# Patient Record
Sex: Male | Born: 1970 | Race: Black or African American | Hispanic: No | Marital: Married | Smoking: Current every day smoker
Health system: Southern US, Community
[De-identification: ages and names within clinical notes are randomized; demographics above are authoritative.]

## PROBLEM LIST (undated history)

## (undated) DIAGNOSIS — G4730 Sleep apnea, unspecified: Secondary | ICD-10-CM

## (undated) DIAGNOSIS — I10 Essential (primary) hypertension: Secondary | ICD-10-CM

## (undated) DIAGNOSIS — R011 Cardiac murmur, unspecified: Secondary | ICD-10-CM

## (undated) DIAGNOSIS — I38 Endocarditis, valve unspecified: Secondary | ICD-10-CM

## (undated) DIAGNOSIS — Z8489 Family history of other specified conditions: Secondary | ICD-10-CM

## (undated) DIAGNOSIS — F1910 Other psychoactive substance abuse, uncomplicated: Secondary | ICD-10-CM

## (undated) DIAGNOSIS — K219 Gastro-esophageal reflux disease without esophagitis: Secondary | ICD-10-CM

## (undated) DIAGNOSIS — A539 Syphilis, unspecified: Secondary | ICD-10-CM

## (undated) DIAGNOSIS — E119 Type 2 diabetes mellitus without complications: Secondary | ICD-10-CM

## (undated) HISTORY — PX: WISDOM TOOTH EXTRACTION: SHX21

## (undated) HISTORY — PX: CHOLECYSTECTOMY: SHX55

---

## 2005-04-09 ENCOUNTER — Emergency Department: Payer: Self-pay | Admitting: General Practice

## 2005-04-09 ENCOUNTER — Emergency Department: Payer: Self-pay | Admitting: Unknown Physician Specialty

## 2005-12-27 ENCOUNTER — Observation Stay: Payer: Self-pay | Admitting: Surgery

## 2006-01-30 ENCOUNTER — Other Ambulatory Visit: Payer: Self-pay

## 2006-01-30 ENCOUNTER — Inpatient Hospital Stay: Payer: Self-pay | Admitting: Internal Medicine

## 2008-01-22 ENCOUNTER — Emergency Department: Payer: Self-pay | Admitting: Emergency Medicine

## 2008-01-22 ENCOUNTER — Other Ambulatory Visit: Payer: Self-pay

## 2010-09-27 ENCOUNTER — Emergency Department: Payer: Self-pay | Admitting: Emergency Medicine

## 2011-10-29 ENCOUNTER — Emergency Department: Payer: Self-pay | Admitting: Emergency Medicine

## 2016-12-16 HISTORY — PX: SOFT TISSUE MASS EXCISION: SHX2419

## 2019-08-05 ENCOUNTER — Emergency Department: Payer: Self-pay

## 2019-08-05 ENCOUNTER — Encounter: Payer: Self-pay | Admitting: Emergency Medicine

## 2019-08-05 ENCOUNTER — Other Ambulatory Visit: Payer: Self-pay

## 2019-08-05 ENCOUNTER — Emergency Department
Admission: EM | Admit: 2019-08-05 | Discharge: 2019-08-05 | Disposition: A | Discharge status: Home / Self Care | Payer: Self-pay | Attending: Emergency Medicine | Admitting: Emergency Medicine

## 2019-08-05 DIAGNOSIS — L03113 Cellulitis of right upper limb: Secondary | ICD-10-CM | POA: Insufficient documentation

## 2019-08-05 DIAGNOSIS — E119 Type 2 diabetes mellitus without complications: Secondary | ICD-10-CM | POA: Insufficient documentation

## 2019-08-05 DIAGNOSIS — F17218 Nicotine dependence, cigarettes, with other nicotine-induced disorders: Secondary | ICD-10-CM | POA: Insufficient documentation

## 2019-08-05 HISTORY — DX: Type 2 diabetes mellitus without complications: E11.9

## 2019-08-05 LAB — CBC WITH DIFFERENTIAL/PLATELET
Abs Immature Granulocytes: 0.02 10*3/uL (ref 0.00–0.07)
Basophils Absolute: 0 10*3/uL (ref 0.0–0.1)
Basophils Relative: 0 %
Eosinophils Absolute: 0.1 10*3/uL (ref 0.0–0.5)
Eosinophils Relative: 1 %
HCT: 41.1 % (ref 39.0–52.0)
Hemoglobin: 12.9 g/dL — ABNORMAL LOW (ref 13.0–17.0)
Immature Granulocytes: 0 %
Lymphocytes Relative: 30 %
Lymphs Abs: 1.7 10*3/uL (ref 0.7–4.0)
MCH: 25.9 pg — ABNORMAL LOW (ref 26.0–34.0)
MCHC: 31.4 g/dL (ref 30.0–36.0)
MCV: 82.5 fL (ref 80.0–100.0)
Monocytes Absolute: 0.6 10*3/uL (ref 0.1–1.0)
Monocytes Relative: 11 %
Neutro Abs: 3.2 10*3/uL (ref 1.7–7.7)
Neutrophils Relative %: 58 %
Platelets: 308 10*3/uL (ref 150–400)
RBC: 4.98 MIL/uL (ref 4.22–5.81)
RDW: 12.4 % (ref 11.5–15.5)
WBC: 5.7 10*3/uL (ref 4.0–10.5)
nRBC: 0 % (ref 0.0–0.2)

## 2019-08-05 LAB — COMPREHENSIVE METABOLIC PANEL
ALT: 62 U/L — ABNORMAL HIGH (ref 0–44)
AST: 46 U/L — ABNORMAL HIGH (ref 15–41)
Albumin: 3.3 g/dL — ABNORMAL LOW (ref 3.5–5.0)
Alkaline Phosphatase: 82 U/L (ref 38–126)
Anion gap: 7 (ref 5–15)
BUN: 13 mg/dL (ref 6–20)
CO2: 24 mmol/L (ref 22–32)
Calcium: 8.6 mg/dL — ABNORMAL LOW (ref 8.9–10.3)
Chloride: 103 mmol/L (ref 98–111)
Creatinine, Ser: 0.72 mg/dL (ref 0.61–1.24)
GFR calc Af Amer: >60 mL/min (ref >60)
GFR calc non Af Amer: >60 mL/min (ref >60)
Glucose, Bld: 258 mg/dL — ABNORMAL HIGH (ref 70–99)
Potassium: 3.6 mmol/L (ref 3.5–5.1)
Sodium: 134 mmol/L — ABNORMAL LOW (ref 135–145)
Total Bilirubin: 1 mg/dL (ref 0.3–1.2)
Total Protein: 7.3 g/dL (ref 6.5–8.1)

## 2019-08-05 MED ORDER — IBUPROFEN 600 MG PO TABS
600.0000 mg | ORAL_TABLET | Freq: Once | ORAL | Status: AC
  Administered 2019-08-05: 600 mg via ORAL
  Filled 2019-08-05: qty 1

## 2019-08-05 MED ORDER — CLINDAMYCIN HCL 150 MG PO CAPS: ORAL_CAPSULE | ORAL | 0 refills | Status: DC

## 2019-08-05 MED ORDER — HYDROCODONE-ACETAMINOPHEN 5-325 MG PO TABS: 1.0000 | ORAL_TABLET | Freq: Four times a day (QID) | ORAL | 0 refills | Status: DC | PRN

## 2019-08-05 MED ORDER — CLINDAMYCIN PHOSPHATE 600 MG/50ML IV SOLN
600.0000 mg | Freq: Once | INTRAVENOUS | Status: AC
  Administered 2019-08-05: 600 mg via INTRAVENOUS
  Filled 2019-08-05: qty 50

## 2019-08-05 NOTE — Discharge Instructions (Addendum)
Call make an appointment with Dr. Harden Mo office if any continued problems.  Moist warm compresses or soak your hand in warm water.  Elevate your hand as needed for swelling.  Begin taking antibiotics as directed until completely finished.  Also a prescription for a narcotic was sent to your pharmacy to help control pain.  You may take ibuprofen with this medication if additional pain medication is needed.  Return to the emergency department if any severe worsening of your symptoms.

## 2019-08-05 NOTE — ED Provider Notes (Signed)
[facility] Emergency Department Provider Note   ____________________________________________   First MD Initiated Contact with Patient 08/05/19 1303     (approximate)  I have reviewed the triage vital signs and the nursing notes.   HISTORY  Chief Complaint Hand Pain   HPI Cameron Watkins is a 48 y.o. male presents to the ED with complaint of right hand pain and swelling.  Patient states that last week he hit his hand while at work and expected it to get better.  He states he had a small abrasion to the dorsal aspect of his hand that bled a small amount.  He denies any fever or chills.  He denies any other injury.  He is continue to use his hand since his injury.  He reports that his hand has been warm to touch.  He is right-hand dominant.  Is his pain as an 8 out of 10.        Past Medical History:  Diagnosis Date  . Diabetes mellitus without complication (HCC)     There are no active problems to display for this patient.   History reviewed. No pertinent surgical history.  Prior to Admission medications   Medication Sig Start Date End Date Taking? Authorizing Provider  clindamycin (CLEOCIN) 150 MG capsule 2 tabs tid until finished 08/05/19   Tommi RumpsSummers, Thamas Appleyard L, PA-C  HYDROcodone-acetaminophen (NORCO/VICODIN) 5-325 MG tablet Take 1 tablet by mouth every 6 (six) hours as needed for moderate pain. 08/05/19   Tommi RumpsSummers, Farrin Shadle L, PA-C    Allergies Bee venom and Penicillins  No family history on file.  Social History Social History   Tobacco Use  . Smoking status: Current Every Day Smoker    Types: Cigars  . Smokeless tobacco: Never Used  Substance Use Topics  . Alcohol use: Not Currently  . Drug use: Not Currently    Review of Systems Constitutional: No fever/chills Cardiovascular: Denies chest pain. Respiratory: Denies shortness of breath. Musculoskeletal: Positive for right hand pain. Skin: Right hand warm to touch. Neurological:  Negative for focal weakness or numbness. ____________________________________________   PHYSICAL EXAM:  VITAL SIGNS: ED Triage Vitals [08/05/19 1214]  Enc Vitals Group     BP (!) 141/90     Pulse Rate (!) 106     Resp 18     Temp 99 F (37.2 C)     Temp Source Oral     SpO2 97 %     Weight 290 lb (131.5 kg)     Height 6\' 3"  (1.905 m)     Head Circumference      Peak Flow      Pain Score 8     Pain Loc      Pain Edu?      Excl. in GC?    Constitutional: Alert and oriented. Well appearing and in no acute distress. Eyes: Conjunctivae are normal. Head: Atraumatic. Neck: No stridor.   Cardiovascular: Normal rate, regular rhythm. Grossly normal heart sounds.  Good peripheral circulation. Respiratory: Normal respiratory effort.  No retractions. Lungs CTAB. Musculoskeletal: On examination of the right hand the dorsal aspect is edematous with mild erythema and warmth.  There is a healing abrasion noted to the MP joint of the third digit.  Patient is able to flex and extend his third digit but PIP joint is edematous and tender to touch.  There is also some erythema noted across the wrist and forearm consistent with a cellulitis.  No drainage present. Neurologic:  Normal  speech and language. No gross focal neurologic deficits are appreciated.  Skin:  Skin is warm, dry and intact.  Psychiatric: Mood and affect are normal. Speech and behavior are normal.  ____________________________________________   LABS (all labs ordered are listed, but only abnormal results are displayed)  Labs Reviewed  CBC WITH DIFFERENTIAL/PLATELET - Abnormal; Notable for the following components:      Result Value   Hemoglobin 12.9 (*)    MCH 25.9 (*)    All other components within normal limits  COMPREHENSIVE METABOLIC PANEL - Abnormal; Notable for the following components:   Sodium 134 (*)    Glucose, Bld 258 (*)    Calcium 8.6 (*)    Albumin 3.3 (*)    AST 46 (*)    ALT 62 (*)    All other  components within normal limits    RADIOLOGY  ED MD interpretation:  Right hand x-ray is negative for acute bony injury.  Official radiology report(s): Dg Hand Complete Right  Result Date: 08/05/2019 CLINICAL DATA:  Right hand pain and swelling since injury last week involving third digit. EXAM: RIGHT HAND - COMPLETE 3+ VIEW COMPARISON:  None. FINDINGS: There is no evidence of fracture or dislocation. There is no evidence of arthropathy or other focal bone abnormality. Soft tissues are unremarkable. IMPRESSION: Negative. Electronically Signed   By: Marin Olp M.D.   On: 08/05/2019 12:58    ____________________________________________   PROCEDURES  Procedure(s) performed (including Critical Care):  Procedures   ____________________________________________   INITIAL IMPRESSION / ASSESSMENT AND PLAN / ED COURSE  As part of my medical decision making, I reviewed the following data within the electronic MEDICAL RECORD NUMBER Notes from prior ED visits and [place] Controlled Substance Database   48 year old male presents to the ED with complaint of right hand pain after he hit something at work.  He states that his hand has been warm and tender to touch.  There is what appears to be a healing abrasion to his third MP joint dorsal aspect.  This possibly could have been an abrasion that now is infected and patient has cellulitis.  His diabetes he states is usually under control.  Nonfasting blood sugar was 258 today.  Patient was given clindamycin 600 mg IV and a prescription to continue on the same.  He is to follow-up with his PCP if any continued problems.  He is told to return to the emergency department if any worsening of his symptoms.  ____________________________________________   FINAL CLINICAL IMPRESSION(S) / ED DIAGNOSES  Final diagnoses:  Cellulitis of right hand     ED Discharge Orders         Ordered    clindamycin (CLEOCIN) 150 MG capsule  Status:  Discontinued      08/05/19 1517    HYDROcodone-acetaminophen (NORCO/VICODIN) 5-325 MG tablet  Every 6 hours PRN     08/05/19 1517    clindamycin (CLEOCIN) 150 MG capsule     08/05/19 1517           Note:  This document was prepared using Dragon voice recognition software and may include unintentional dictation errors.    Johnn Hai, PA-C 08/05/19 1539    Earleen Newport, MD 08/08/19 [phone]

## 2019-08-05 NOTE — ED Triage Notes (Signed)
Pt presents to ED via POV with c/o R hand pain and swelling. Pt states last week injured 3rd digit on R hand while at work, never had it evaluated, states swelling of hand started last night. Pt states not a worker's comp at this time.

## 2019-10-20 ENCOUNTER — Ambulatory Visit: Payer: Self-pay

## 2019-10-20 ENCOUNTER — Other Ambulatory Visit: Payer: Self-pay

## 2019-10-20 ENCOUNTER — Ambulatory Visit: Payer: Self-pay | Admitting: Physician Assistant

## 2019-10-20 DIAGNOSIS — N341 Nonspecific urethritis: Secondary | ICD-10-CM

## 2019-10-20 DIAGNOSIS — Z113 Encounter for screening for infections with a predominantly sexual mode of transmission: Secondary | ICD-10-CM

## 2019-10-20 LAB — GRAM STAIN

## 2019-10-20 MED ORDER — AZITHROMYCIN 500 MG PO TABS
1000.0000 mg | ORAL_TABLET | Freq: Once | ORAL | Status: AC
  Administered 2019-10-20: 12:00:00 1000 mg via ORAL

## 2019-10-21 ENCOUNTER — Encounter: Payer: Self-pay | Admitting: Physician Assistant

## 2019-10-21 NOTE — Progress Notes (Signed)
    STI clinic/screening visit  Subjective:  Cameron Watkins is a 48 y.o. male being seen today for an STI screening visit. The patient reports they do have symptoms.  Patient has the following medical conditions:  There are no active problems to display for this patient.    Chief Complaint  Patient presents with  . SEXUALLY TRANSMITTED DISEASE    HPI  Patient reports that he has had dysuria for 1 week.  Denies other symptoms and requests screening today.  Last void < 2 hr prior to specimen collection.  See flowsheet for further details and programmatic requirements.    The following portions of the patient's history were reviewed and updated as appropriate: allergies, current medications, past medical history, past social history, past surgical history and problem list.  Objective:  There were no vitals filed for this visit.  Physical Exam Constitutional:      General: He is not in acute distress.    Appearance: Normal appearance.  HENT:     Head: Normocephalic and atraumatic.     Mouth/Throat:     Mouth: Mucous membranes are moist.     Pharynx: Oropharynx is clear. No oropharyngeal exudate or posterior oropharyngeal erythema.  Eyes:     Conjunctiva/sclera: Conjunctivae normal.  Neck:     Musculoskeletal: Neck supple.  Pulmonary:     Effort: Pulmonary effort is normal.  Abdominal:     Palpations: Abdomen is soft. There is no mass.     Tenderness: There is no abdominal tenderness. There is no guarding or rebound.  Genitourinary:    Penis: Normal.      Scrotum/Testes: Normal.     Comments: Pubic area without nits, lice, edema, erythema, lesions and inguinal adenopathy. Penis circumcised and without discharge from meatus. Lymphadenopathy:     Cervical: No cervical adenopathy.  Skin:    General: Skin is warm and dry.     Findings: No bruising, erythema, lesion or rash.  Neurological:     Mental Status: He is alert and oriented to person, place, and time.   Psychiatric:        Mood and Affect: Mood normal.        Behavior: Behavior normal.        Thought Content: Thought content normal.        Judgment: Judgment normal.       Assessment and Plan:  Cameron Watkins is a 48 y.o. male presenting to the [facility] Department for STI screening  1. Screening for STD (sexually transmitted disease) Patient with symptoms today.   Rec condoms with all sex. Await test results.  Counseled that RN will call if needs to RTC for further treatment once results are back. - Gram stain - HBV Antigen/Antibody State Lab - HIV/HCV [place] [facility] - Syphilis Serology,  Lab - Gonococcus culture - Gonococcus culture  2. 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 No sex for 7 days and until after partner completes treatment. RTC if vomits < 2 hr after taking medicine. - azithromycin (ZITHROMAX) tablet 1,000 mg     No follow-ups on file.  No future appointments.  Jerene Dilling, PA

## 2019-10-25 LAB — GONOCOCCUS CULTURE

## 2019-10-26 LAB — HEPATITIS B SURFACE ANTIGEN

## 2019-10-26 LAB — HM HIV SCREENING LAB: HM HIV Screening: NEGATIVE

## 2019-10-26 LAB — HM HEPATITIS C SCREENING LAB: HM Hepatitis Screen: NEGATIVE

## 2019-12-31 ENCOUNTER — Other Ambulatory Visit: Payer: Self-pay

## 2019-12-31 ENCOUNTER — Emergency Department
Admission: EM | Admit: 2019-12-31 | Discharge: 2019-12-31 | Disposition: A | Discharge status: Home / Self Care | Payer: Self-pay | Attending: Emergency Medicine | Admitting: Emergency Medicine

## 2019-12-31 ENCOUNTER — Emergency Department: Payer: Self-pay

## 2019-12-31 ENCOUNTER — Encounter: Payer: Self-pay | Admitting: Intensive Care

## 2019-12-31 DIAGNOSIS — F17290 Nicotine dependence, other tobacco product, uncomplicated: Secondary | ICD-10-CM | POA: Diagnosis not present

## 2019-12-31 DIAGNOSIS — E119 Type 2 diabetes mellitus without complications: Secondary | ICD-10-CM | POA: Insufficient documentation

## 2019-12-31 DIAGNOSIS — Y999 Unspecified external cause status: Secondary | ICD-10-CM | POA: Insufficient documentation

## 2019-12-31 DIAGNOSIS — S39012A Strain of muscle, fascia and tendon of lower back, initial encounter: Secondary | ICD-10-CM | POA: Insufficient documentation

## 2019-12-31 DIAGNOSIS — Y92410 Unspecified street and highway as the place of occurrence of the external cause: Secondary | ICD-10-CM | POA: Diagnosis not present

## 2019-12-31 DIAGNOSIS — Y9389 Activity, other specified: Secondary | ICD-10-CM | POA: Insufficient documentation

## 2019-12-31 DIAGNOSIS — S3992XA Unspecified injury of lower back, initial encounter: Secondary | ICD-10-CM | POA: Diagnosis present

## 2019-12-31 MED ORDER — CYCLOBENZAPRINE HCL 10 MG PO TABS: 10.0000 mg | ORAL_TABLET | Freq: Three times a day (TID) | ORAL | 0 refills | Status: DC | PRN

## 2019-12-31 MED ORDER — OXYCODONE-ACETAMINOPHEN 5-325 MG PO TABS
1.0000 | ORAL_TABLET | Freq: Once | ORAL | Status: AC
  Administered 2019-12-31: 1 via ORAL
  Filled 2019-12-31: qty 1

## 2019-12-31 MED ORDER — TRAMADOL HCL 50 MG PO TABS: 50.0000 mg | ORAL_TABLET | Freq: Four times a day (QID) | ORAL | 0 refills | Status: DC | PRN

## 2019-12-31 MED ORDER — MELOXICAM 15 MG PO TABS: 15.0000 mg | ORAL_TABLET | Freq: Every day | ORAL | 2 refills | Status: AC
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
## 2019-12-31 NOTE — Discharge Instructions (Addendum)
Follow-up with your regular doctor or Dr. Allena Katz if not better in 1 week.  Use medications as prescribed.  Return emergency department worsening.

## 2019-12-31 NOTE — ED Notes (Signed)
esign not working pt verbalized discharge instructions and has no questions at this time 

## 2020-10-30 ENCOUNTER — Ambulatory Visit: Payer: Self-pay | Admitting: Physician Assistant

## 2020-10-30 ENCOUNTER — Other Ambulatory Visit: Payer: Self-pay

## 2020-10-30 ENCOUNTER — Encounter: Payer: Self-pay | Admitting: Physician Assistant

## 2020-10-30 DIAGNOSIS — Z113 Encounter for screening for infections with a predominantly sexual mode of transmission: Secondary | ICD-10-CM

## 2020-10-30 DIAGNOSIS — E119 Type 2 diabetes mellitus without complications: Secondary | ICD-10-CM

## 2020-10-30 DIAGNOSIS — Z202 Contact with and (suspected) exposure to infections with a predominantly sexual mode of transmission: Secondary | ICD-10-CM

## 2020-10-30 LAB — GRAM STAIN

## 2020-10-30 MED ORDER — METRONIDAZOLE 500 MG PO TABS: 500.0000 mg | ORAL_TABLET | Freq: Two times a day (BID) | ORAL | 0 refills | Status: DC

## 2020-10-30 NOTE — Progress Notes (Signed)
[facility] STI clinic/screening visit  Subjective:  Cameron Watkins is a 49 y.o. male being seen today for an STI screening visit. The patient reports they do not have symptoms.    Patient has the following medical conditions:  There are no problems to display for this patient.    Chief Complaint  Patient presents with  . SEXUALLY TRANSMITTED DISEASE    screening    HPI  Patient reports that he is not having any symptoms but is a contact to Trich and would like a screening today.  Denies surgeries and states that he takes Metformin for DM as directed.  States last HIV test was 6 months ago and last void prior to sample collection for Gram stain was over 2 hr ago.   See flowsheet for further details and programmatic requirements.    The following portions of the patient's history were reviewed and updated as appropriate: allergies, current medications, past medical history, past social history, past surgical history and problem list.  Objective:  There were no vitals filed for this visit.  Physical Exam Constitutional:      General: He is not in acute distress.    Appearance: Normal appearance.  HENT:     Head: Normocephalic and atraumatic.     Comments: No nits,lice, or hair loss. No cervical, supraclavicular or axillary adenopathy.    Mouth/Throat:     Mouth: Mucous membranes are moist.     Pharynx: Oropharynx is clear. No oropharyngeal exudate or posterior oropharyngeal erythema.  Eyes:     Conjunctiva/sclera: Conjunctivae normal.  Pulmonary:     Effort: Pulmonary effort is normal.  Abdominal:     Palpations: Abdomen is soft. There is no mass.     Tenderness: There is no abdominal tenderness. There is no guarding or rebound.  Genitourinary:    Penis: Normal.      Testes: Normal.     Comments: Pubic area without nits, lice, hair loss, edema, erythema, lesions and inguinal adenopathy. Penis circumcised without rash, lesions and discharge  at meatus. Musculoskeletal:     Cervical back: Neck supple. No tenderness.  Skin:    General: Skin is warm and dry.     Findings: No bruising, erythema, lesion or rash.  Neurological:     Mental Status: He is alert and oriented to person, place, and time.  Psychiatric:        Mood and Affect: Mood normal.        Behavior: Behavior normal.        Thought Content: Thought content normal.        Judgment: Judgment normal.       Assessment and Plan:  Cameron Watkins is a 49 y.o. male presenting to the [facility] Department for STI screening  1. Screening for STD (sexually transmitted disease) Patient into clinic without symptoms. Rec condoms with all sex. Await test results.  Counseled that RN will call if needs to RTC for treatment once results are back. - Gram stain - Gonococcus culture - HIV [place] [facility] - Syphilis Serology, [facility] - Gonococcus culture  2. Trichomonas contact Will treat as a contact to Trich with Metronidazole 500 mg # 14 1 po BID for 7 days with food, no EtOH for 24 hr before and until 72 hr after completing medicine. No sex for 10 days and until after partner completes treatment. Call with questions or concerns. - metroNIDAZOLE (FLAGYL) 500 MG tablet; Take 1 tablet (500 mg  total) by mouth 2 (two) times daily.  Dispense: 14 tablet; Refill: 0     No follow-ups on file.  No future appointments.  Matt Holmes, PA

## 2020-11-03 NOTE — Progress Notes (Signed)
Chart reviewed by Pharmacist  Suzanne Walker PharmD, Contract Pharmacist at [facility]  

## 2020-11-04 LAB — GONOCOCCUS CULTURE

## 2020-11-07 ENCOUNTER — Telehealth: Payer: Self-pay

## 2020-11-13 ENCOUNTER — Other Ambulatory Visit: Payer: Self-pay

## 2020-11-13 ENCOUNTER — Ambulatory Visit: Payer: Self-pay

## 2020-11-13 DIAGNOSIS — A539 Syphilis, unspecified: Secondary | ICD-10-CM

## 2020-11-13 MED ORDER — DOXYCYCLINE HYCLATE 100 MG PO TABS: 100.0000 mg | ORAL_TABLET | Freq: Two times a day (BID) | ORAL | 0 refills | Status: AC

## 2020-11-13 NOTE — Progress Notes (Signed)
Co to RTC in 6 and 12 months Richmond Campbell, RN

## 2020-11-22 NOTE — Telephone Encounter (Signed)
Tx'd Alira Fretwell, RN  

## 2021-05-29 ENCOUNTER — Emergency Department: Payer: Self-pay

## 2021-05-29 ENCOUNTER — Emergency Department
Admission: EM | Admit: 2021-05-29 | Discharge: 2021-05-29 | Disposition: A | Discharge status: Home / Self Care | Payer: Self-pay | Attending: Emergency Medicine | Admitting: Emergency Medicine

## 2021-05-29 ENCOUNTER — Other Ambulatory Visit: Payer: Self-pay

## 2021-05-29 DIAGNOSIS — R079 Chest pain, unspecified: Secondary | ICD-10-CM | POA: Insufficient documentation

## 2021-05-29 DIAGNOSIS — R110 Nausea: Secondary | ICD-10-CM | POA: Insufficient documentation

## 2021-05-29 DIAGNOSIS — Z7984 Long term (current) use of oral hypoglycemic drugs: Secondary | ICD-10-CM | POA: Insufficient documentation

## 2021-05-29 DIAGNOSIS — E119 Type 2 diabetes mellitus without complications: Secondary | ICD-10-CM | POA: Insufficient documentation

## 2021-05-29 DIAGNOSIS — R42 Dizziness and giddiness: Secondary | ICD-10-CM | POA: Insufficient documentation

## 2021-05-29 DIAGNOSIS — F17290 Nicotine dependence, other tobacco product, uncomplicated: Secondary | ICD-10-CM | POA: Insufficient documentation

## 2021-05-29 LAB — CBC
HCT: 41.4 % (ref 39.0–52.0)
Hemoglobin: 13.7 g/dL (ref 13.0–17.0)
MCH: 27.7 pg (ref 26.0–34.0)
MCHC: 33.1 g/dL (ref 30.0–36.0)
MCV: 83.6 fL (ref 80.0–100.0)
Platelets: 243 10*3/uL (ref 150–400)
RBC: 4.95 MIL/uL (ref 4.22–5.81)
RDW: 12.5 % (ref 11.5–15.5)
WBC: 3.5 10*3/uL — ABNORMAL LOW (ref 4.0–10.5)
nRBC: 0 % (ref 0.0–0.2)

## 2021-05-29 LAB — BASIC METABOLIC PANEL
Anion gap: 6 (ref 5–15)
BUN: 11 mg/dL (ref 6–20)
CO2: 24 mmol/L (ref 22–32)
Calcium: 8.6 mg/dL — ABNORMAL LOW (ref 8.9–10.3)
Chloride: 105 mmol/L (ref 98–111)
Creatinine, Ser: 0.88 mg/dL (ref 0.61–1.24)
GFR, Estimated: >60 mL/min (ref >60)
Glucose, Bld: 104 mg/dL — ABNORMAL HIGH (ref 70–99)
Potassium: 3.2 mmol/L — ABNORMAL LOW (ref 3.5–5.1)
Sodium: 135 mmol/L (ref 135–145)

## 2021-05-29 LAB — TROPONIN I (HIGH SENSITIVITY)
Troponin I (High Sensitivity): 4 ng/L (ref <18)
Troponin I (High Sensitivity): 3 ng/L (ref <18)

## 2021-05-29 LAB — D-DIMER, QUANTITATIVE: D-Dimer, Quant: <0.27 ug/mL-FEU (ref 0.00–0.50)

## 2021-05-29 MED ORDER — METFORMIN HCL 500 MG PO TABS: 500.0000 mg | ORAL_TABLET | Freq: Two times a day (BID) | ORAL | 0 refills | Status: DC

## 2021-05-29 MED ORDER — POTASSIUM CHLORIDE CRYS ER 20 MEQ PO TBCR
40.0000 meq | EXTENDED_RELEASE_TABLET | Freq: Once | ORAL | Status: AC
  Administered 2021-05-29: 40 meq via ORAL
  Filled 2021-05-29: qty 2

## 2021-05-29 NOTE — ED Notes (Signed)
See triage note  Presents with some rapid heart rate and chest discomfort  States sx's started suddenly while sitting in car  Then felt some discomfort in left arm  Stats sx's are better at present  Pt is also out of meds   Metformin 500mg  and lisinopril 10 mg

## 2021-05-29 NOTE — ED Triage Notes (Addendum)
First RN Note: Pt to ED via [facility] with c/o substernal CP that radiates to L arm, per EMS pt reports CP has resolved, now has continued L arm weakness/numbness. Per EMS stroke screen negative, 324 ASA given en route.   134/86 102 ST CBG 121 100% RA

## 2021-05-29 NOTE — Discharge Instructions (Addendum)

## 2021-05-29 NOTE — ED Triage Notes (Signed)
Pt comes into the ED via EMS from home, pt states he had sudden onset left sided chest pain with left arm heaviness  and SOB, states his cousin gave him an aspirin   Pt is in NAD at present. Arrives with #20g to the [facility]

## 2021-05-29 NOTE — ED Provider Notes (Signed)
[facility] Emergency Department Provider Note   ____________________________________________   Event Date/Time   First MD Initiated Contact with Patient 05/29/21 1155     (approximate)  I have reviewed the triage vital signs and the nursing notes.   HISTORY  Chief Complaint Chest Pain    HPI A 50 year old patient with a history of treated diabetes, hypertension and obesity presents for evaluation of chest pain. Initial onset of pain was approximately 1-3 hours ago. The patient's chest pain is described as heaviness/pressure/tightness and is not worse with exertion. The patient complains of nausea. The patient's chest pain is middle- or left-sided, is not well-localized, is not sharp and does radiate to the arms/jaw/neck. The patient denies diaphoresis. The patient has smoked in the past 90 days and has a family history of coronary artery disease in a first-degree relative with onset less than age 14. The patient has no history of stroke, has no history of peripheral artery disease and has no history of hypercholesterolemia.   Patient reports he feels much improved now.  Was treated with 324 mg of aspirin with EMS.  Pain came on as he was driving his car into family's driveway.  He parked the car and noticed that he started having pain in his left upper chest.  This then radiated into his chest he felt nauseated lightheaded, and then thereafter reports he felt his heart was racing very fast, and he then began to feel numb in both of his arms and discomfort radiated towards his left upper chest and left arm as well  He had does have diabetes, currently not taking any medication however reports he ran out of metformin about a month ago.  He has no history personal history of heart disease but his mom has a history of coronary disease  He has never had a stress test.  His symptoms have all abated now and he feels improved.  He felt like his arms were numb but that  has improved.  He reports that all symptoms feel better now.  No history of blood clots.  He is a smoker.  No noted leg swelling though occasionally he will see some swelling in both of his feet that will come and go  No sharp chest pain.  Past Medical History:  Diagnosis Date   Diabetes mellitus without complication [facility]     Patient Active Problem List   Diagnosis Date Noted   Diabetes mellitus without complication (HCC) 10/30/2020    History reviewed. No pertinent surgical history.  Prior to Admission medications   Medication Sig Start Date End Date Taking? Authorizing Provider  metFORMIN (GLUCOPHAGE) 500 MG tablet Take 1 tablet (500 mg total) by mouth 2 (two) times daily with a meal. 05/29/21  Yes Sharyn Creamer, MD    Allergies Bee venom and Penicillins  No family history on file.  Social History Social History   Tobacco Use   Smoking status: Every Day    Pack years: 0.00    Types: Cigars   Smokeless tobacco: Never  Substance Use Topics   Alcohol use: Yes    Alcohol/week: 4.0 standard drinks    Types: 4 Shots of liquor per week   Drug use: Yes    Types: Marijuana    Review of Systems Constitutional: No fever/chills Eyes: No visual changes. ENT: No sore throat. Cardiovascular: See HPI Respiratory: Denies shortness of breath. Gastrointestinal: No abdominal pain.   Genitourinary: Negative for dysuria. Musculoskeletal: Negative for back pain. Skin: Negative  for rash. Neurological: Negative for headaches or focal weakness, but did feel numb in both his arms but left greater than right.    ____________________________________________   PHYSICAL EXAM:  VITAL SIGNS: ED Triage Vitals  Enc Vitals Group     BP 05/29/21 1054 137/84     Pulse Rate 05/29/21 1054 83     Resp 05/29/21 1054 17     Temp 05/29/21 1054 99.7 F (37.6 C)     Temp Source 05/29/21 1054 Oral     SpO2 05/29/21 1054 99 %     Weight 05/29/21 1055 290 lb (131.5 kg)     Height 05/29/21  1055 6\' 4"  (1.93 m)     Head Circumference --      Peak Flow --      Pain Score 05/29/21 1055 4     Pain Loc --      Pain Edu? --      Excl. in GC? --     Constitutional: Alert and oriented. Well appearing and in no acute distress.  Very normal very reassuring examination at this time.  Patient reports all symptoms have abated Eyes: Conjunctivae are normal. Head: Atraumatic. Nose: No congestion/rhinnorhea. Mouth/Throat: Mucous membranes are moist. Neck: No stridor.  Cardiovascular: Normal rate, regular rhythm. Grossly normal heart sounds.  Good peripheral circulation. Respiratory: Normal respiratory effort.  No retractions. Lungs CTAB. Gastrointestinal: Soft and nontender. No distention. Musculoskeletal: No lower extremity tenderness nor edema. Neurologic:  Normal speech and language. No gross focal neurologic deficits are appreciated.  Skin:  Skin is warm, dry and intact. No rash noted. Psychiatric: Mood and affect are normal. Speech and behavior are normal.  ____________________________________________   LABS (all labs ordered are listed, but only abnormal results are displayed)  Labs Reviewed  BASIC METABOLIC PANEL - Abnormal; Notable for the following components:      Result Value   Potassium 3.2 (*)    Glucose, Bld 104 (*)    Calcium 8.6 (*)    All other components within normal limits  CBC - Abnormal; Notable for the following components:   WBC 3.5 (*)    All other components within normal limits  D-DIMER, QUANTITATIVE  TROPONIN I (HIGH SENSITIVITY)  TROPONIN I (HIGH SENSITIVITY)   ____________________________________________  EKG  ED ECG REPORT I, 05/31/21, the attending physician, personally viewed and interpreted this ECG.  Date: 05/29/2021 EKG Time: 1102 Rate: 85 Rhythm: normal sinus rhythm QRS Axis: normal Intervals: normal ST/T Wave abnormalities: normal Narrative Interpretation: no evidence of acute  ischemia  ____________________________________________  RADIOLOGY  DG Chest 2 View  Result Date: 05/29/2021 CLINICAL DATA:  49 year old male with substernal chest pain radiating to the left arm. EXAM: CHEST - 2 VIEW COMPARISON:  None. FINDINGS: Mildly low lung volumes. Normal cardiac size and mediastinal contours. Visualized tracheal air column is within normal limits. Both lungs appear clear. No pneumothorax or pleural effusion. Cholecystectomy clips and paucity of bowel gas in the upper abdomen. No osseous abnormality identified. IMPRESSION: Negative.  No cardiopulmonary abnormality. Electronically Signed   By: 59 M.D.   On: 05/29/2021 11:51     ____________________________________________   PROCEDURES  Procedure(s) performed: None  Procedures  Critical Care performed: No  ____________________________________________   INITIAL IMPRESSION / ASSESSMENT AND PLAN / ED COURSE  Pertinent labs & imaging results that were available during my care of the patient were reviewed by me and considered in my medical decision making (see chart for details).   Differential diagnosis  includes, but is not limited to, ACS, aortic dissection, pulmonary embolism, cardiac tamponade, pneumothorax, pneumonia, pericarditis, myocarditis, GI-related causes including esophagitis/gastritis, and musculoskeletal chest wall pain.  No signs or symptoms that would suggest dissection.  No infectious symptoms.  Reassuring exam, normal chest x-ray.   Clinical Course as of 05/29/21 1405  Tue May 29, 2021  1155 WBC(!): 3.5 [MQ]  1155 Potassium(!): 3.2 Mild low. Prior WBC 5 a year prior [MQ]  1308 D-Dimer, Quant: <0.27 Normal (rule out PE) [MQ]    Clinical Course User Index [MQ] Sharyn Creamer, MD   HEAR Score: 5   ----------------------------------------- 2:04 PM on 05/29/2021 ----------------------------------------- Patient asymptomatic, ambulatory.  Reports he is ready to go and will follow-up very  closely with cardiology.  He reports that he has been out of his metformin now for over a month, he lost his previous primary care physician.  He was taking 500 mg twice a day and would like a refill on this which I will provide him.  Understands careful return precautions especially those around chest pain and a recommendation to follow-up with cardiology for which I have also placed referral but he will call today to set up.  Return precautions and treatment recommendations and follow-up discussed with the patient who is agreeable with the plan.   ____________________________________________   FINAL CLINICAL IMPRESSION(S) / ED DIAGNOSES  Final diagnoses:  Chest pain with low risk of acute coronary syndrome        Note:  This document was prepared using Dragon voice recognition software and may include unintentional dictation errors       Sharyn Creamer, MD 05/29/21 1405

## 2021-08-06 ENCOUNTER — Other Ambulatory Visit: Payer: Self-pay

## 2021-08-06 DIAGNOSIS — E11649 Type 2 diabetes mellitus with hypoglycemia without coma: Secondary | ICD-10-CM | POA: Insufficient documentation

## 2021-08-06 DIAGNOSIS — Z7984 Long term (current) use of oral hypoglycemic drugs: Secondary | ICD-10-CM | POA: Insufficient documentation

## 2021-08-06 DIAGNOSIS — F17290 Nicotine dependence, other tobacco product, uncomplicated: Secondary | ICD-10-CM | POA: Insufficient documentation

## 2021-08-06 DIAGNOSIS — Z20822 Contact with and (suspected) exposure to covid-19: Secondary | ICD-10-CM | POA: Insufficient documentation

## 2021-08-06 LAB — CBC
HCT: 42.6 % (ref 39.0–52.0)
Hemoglobin: 13.7 g/dL (ref 13.0–17.0)
MCH: 27 pg (ref 26.0–34.0)
MCHC: 32.2 g/dL (ref 30.0–36.0)
MCV: 84 fL (ref 80.0–100.0)
Platelets: 286 10*3/uL (ref 150–400)
RBC: 5.07 MIL/uL (ref 4.22–5.81)
RDW: 12 % (ref 11.5–15.5)
WBC: 3.8 10*3/uL — ABNORMAL LOW (ref 4.0–10.5)
nRBC: 0 % (ref 0.0–0.2)

## 2021-08-06 LAB — BASIC METABOLIC PANEL
Anion gap: 8 (ref 5–15)
BUN: 11 mg/dL (ref 6–20)
CO2: 25 mmol/L (ref 22–32)
Calcium: 9 mg/dL (ref 8.9–10.3)
Chloride: 103 mmol/L (ref 98–111)
Creatinine, Ser: 0.98 mg/dL (ref 0.61–1.24)
GFR, Estimated: >60 mL/min (ref >60)
Glucose, Bld: 145 mg/dL — ABNORMAL HIGH (ref 70–99)
Potassium: 3.3 mmol/L — ABNORMAL LOW (ref 3.5–5.1)
Sodium: 136 mmol/L (ref 135–145)

## 2021-08-06 LAB — CBG MONITORING, ED
Glucose-Capillary: 160 mg/dL — ABNORMAL HIGH (ref 70–99)
Glucose-Capillary: 75 mg/dL (ref 70–99)

## 2021-08-06 LAB — URINALYSIS, COMPLETE (UACMP) WITH MICROSCOPIC
Bacteria, UA: NONE SEEN
Bilirubin Urine: NEGATIVE
Glucose, UA: NEGATIVE mg/dL
Hgb urine dipstick: NEGATIVE
Ketones, ur: NEGATIVE mg/dL
Leukocytes,Ua: NEGATIVE
Nitrite: NEGATIVE
Protein, ur: NEGATIVE mg/dL
Specific Gravity, Urine: 1.012 (ref 1.005–1.030)
pH: 6 (ref 5.0–8.0)

## 2021-08-06 NOTE — ED Notes (Signed)
Pt comes via EMs with c/o hyper/hypo glycemia. Pt states his sugars have been going up and down. Pt states UC d/c his metformin. EMs reports CBG was 64 per pt he ate something and then it was 167. Pt states hx of this and was dx with a STI. Pt states some burning from penis.  VSS 18 g lac

## 2021-08-06 NOTE — ED Notes (Signed)
Pt given crackers at this time.

## 2021-08-07 ENCOUNTER — Emergency Department
Admission: EM | Admit: 2021-08-07 | Discharge: 2021-08-07 | Disposition: A | Discharge status: Home / Self Care | Payer: Self-pay | Attending: Emergency Medicine | Admitting: Emergency Medicine

## 2021-08-07 DIAGNOSIS — E162 Hypoglycemia, unspecified: Secondary | ICD-10-CM

## 2021-08-07 LAB — CHLAMYDIA/NGC RT PCR (ARMC ONLY)
Chlamydia Tr: NOT DETECTED
N gonorrhoeae: NOT DETECTED

## 2021-08-07 LAB — CBG MONITORING, ED
Glucose-Capillary: 104 mg/dL — ABNORMAL HIGH (ref 70–99)
Glucose-Capillary: 94 mg/dL (ref 70–99)

## 2021-08-07 LAB — RESP PANEL BY RT-PCR (FLU A&B, COVID) ARPGX2
Influenza A by PCR: NEGATIVE
Influenza B by PCR: NEGATIVE
SARS Coronavirus 2 by RT PCR: NEGATIVE

## 2021-08-07 NOTE — ED Provider Notes (Signed)
[facility] Emergency Department Provider Note  ____________________________________________  Time seen: Approximately 12:26 AM  I have reviewed the triage vital signs and the nursing notes.   HISTORY  Chief Complaint Exposure to STD, Hypoglycemia, and Loss of Consciousness   HPI Cameron Watkins is a 50 y.o. male with a history of diabetes on metformin who presents for evaluation of syncope and hypoglycemia.  Patient reports that he has been having episodes of hypoglycemia for the last week.  Went to urgent care 5 days ago and had his metformin discontinued.  He does not take any other medications at home.  He reports that his sugars continue to drop.  This evening he reports feeling dizzy and lightheaded and had a syncopal event.  He reports that he was sitting down and did not injure himself.  He checked his sugar which was in the 60s.  He ate something and the sugars then improved to 167.  Patient reports 1 prior similar episode of several days of hypoglycemia in the setting of an STD.  He does endorse some mild dysuria earlier today but that has resolved.  Denies any penile discharge, abdominal pain, flank pain, nausea or vomiting, diarrhea, fever or chills, chest pain or shortness of breath.  Patient reports normal appetite and has been eating and drinking normally.  Patient does endorse significant weight loss over the last couple months.   Past Medical History:  Diagnosis Date   Diabetes mellitus without complication [facility])     Patient Active Problem List   Diagnosis Date Noted   Diabetes mellitus without complication (HCC) 10/30/2020    History reviewed. No pertinent surgical history.  Prior to Admission medications   Medication Sig Start Date End Date Taking? Authorizing Provider  metFORMIN (GLUCOPHAGE) 500 MG tablet Take 1 tablet (500 mg total) by mouth 2 (two) times daily with a meal. 05/29/21   Sharyn Creamer, MD    Allergies Bee venom and  Penicillins  History reviewed. No pertinent family history.  Social History Social History   Tobacco Use   Smoking status: Every Day    Types: Cigars   Smokeless tobacco: Never  Substance Use Topics   Alcohol use: Yes    Alcohol/week: 4.0 standard drinks    Types: 4 Shots of liquor per week   Drug use: Yes    Types: Marijuana    Review of Systems  Constitutional: Negative for fever. + syncope Eyes: Negative for visual changes. ENT: Negative for sore throat. Neck: No neck pain  Cardiovascular: Negative for chest pain. Respiratory: Negative for shortness of breath. Gastrointestinal: Negative for abdominal pain, vomiting or diarrhea. Genitourinary: + dysuria. Musculoskeletal: Negative for back pain. Skin: Negative for rash. Neurological: Negative for headaches, weakness or numbness. Psych: No SI or HI  ____________________________________________   PHYSICAL EXAM:  VITAL SIGNS: ED Triage Vitals  Enc Vitals Group     BP 08/06/21 1808 (!) 148/95     Pulse Rate 08/06/21 1808 81     Resp 08/06/21 1808 18     Temp --      Temp src --      SpO2 08/06/21 1808 98 %     Weight 08/06/21 1811 280 lb (127 kg)     Height 08/06/21 1811 6\' 3"  (1.905 m)     Head Circumference --      Peak Flow --      Pain Score 08/06/21 1811 0     Pain Loc --  Pain Edu? --      Excl. in GC? --     Constitutional: Alert and oriented. Well appearing and in no apparent distress. HEENT:      Head: Normocephalic and atraumatic.         Eyes: Conjunctivae are normal. Sclera is non-icteric.       Mouth/Throat: Mucous membranes are moist.       Neck: Supple with no signs of meningismus. Cardiovascular: Regular rate and rhythm. No murmurs, gallops, or rubs. 2+ symmetrical distal pulses are present in all extremities. No JVD. Respiratory: Normal respiratory effort. Lungs are clear to auscultation bilaterally.  Gastrointestinal: Soft, non tender, and non distended with positive bowel sounds.  No rebound or guarding. Genitourinary: No CVA tenderness. Musculoskeletal:  No edema, cyanosis, or erythema of extremities. Neurologic: Normal speech and language. Face is symmetric. Moving all extremities. No gross focal neurologic deficits are appreciated. Skin: Skin is warm, dry and intact. No rash noted. Psychiatric: Mood and affect are normal. Speech and behavior are normal.  ____________________________________________   LABS (all labs ordered are listed, but only abnormal results are displayed)  Labs Reviewed  BASIC METABOLIC PANEL - Abnormal; Notable for the following components:      Result Value   Potassium 3.3 (*)    Glucose, Bld 145 (*)    All other components within normal limits  CBC - Abnormal; Notable for the following components:   WBC 3.8 (*)    All other components within normal limits  URINALYSIS, COMPLETE (UACMP) WITH MICROSCOPIC - Abnormal; Notable for the following components:   Color, Urine YELLOW (*)    APPearance CLEAR (*)    All other components within normal limits  CBG MONITORING, ED - Abnormal; Notable for the following components:   Glucose-Capillary 160 (*)    All other components within normal limits  CBG MONITORING, ED - Abnormal; Notable for the following components:   Glucose-Capillary 104 (*)    All other components within normal limits  CHLAMYDIA/NGC RT PCR ([facility] ONLY)            RESP PANEL BY RT-PCR (FLU A&B, COVID) [id number]  CBG MONITORING, ED  CBG MONITORING, ED   ____________________________________________  EKG  ED ECG REPORT I, Nita Sickle, the attending physician, personally viewed and interpreted this ECG.  Sinus rhythm with a rate of 67, normal intervals, normal axis, no ST elevations or depressions. ____________________________________________  RADIOLOGY  none  ____________________________________________   PROCEDURES  Procedure(s) performed: None Procedures Critical Care performed:   None ____________________________________________   INITIAL IMPRESSION / ASSESSMENT AND PLAN / ED COURSE   50 y.o. male with a history of diabetes on metformin who presents for evaluation of syncope and hypoglycemia and syncope.  Patient with recurrent episodes of hypoglycemia at home for the past week.  Has been off of his metformin for 5 days.  Today had another episode of hypoglycemia and syncope.  Has no other localizing complaints or findings on examination. EKG WNL. Prior similar episode of hypoglycemia in the past with an STD.  Patient does not seem to have symptoms of it but will check for GC chlamydia.  Urine with no signs of urinary tract infection.  Patient has no tachycardia and no fever.  Initial CBG of 145.  After seen in the waiting room for several hours without eating repeat CBG was 75.  He does have mild leukopenia which is stable, no significant electrolyte derangements otherwise.  We will get orthostatic vital signs, check urine for  STD, repeat CBG and monitor closely for any signs of hypoglycemia.  Patient endorses significant weight loss over the last several months therefore it could be that his diabetes is just better control and he does not need to be on metformin.  _________________________ 2:34 AM on 08/07/2021 ----------------------------------------- Serial CBGs with no signs of hypoglycemia.  STD screening negative.  Blood work without acute findings.  COVID and flu negative.  Discussed hypoglycemia prevention with patient and family member who is at bedside.  Discussed need to close follow-up with PCP.  Recommended continue to hold metformin.  Recommended returning to the emergency room for sugar less than 60, chest pain, shortness of breath, abdominal pain, fever.      _____________________________________________ Please note:  Patient was evaluated in Emergency Department today for the symptoms described in the history of present illness. Patient was evaluated in the  context of the global COVID-19 pandemic, which necessitated consideration that the patient might be at risk for infection with the SARS-CoV-2 virus that causes COVID-19. Institutional protocols and algorithms that pertain to the evaluation of patients at risk for COVID-19 are in a state of rapid change based on information released by regulatory bodies including the CDC and federal and state organizations. These policies and algorithms were followed during the patient's care in the ED.  Some ED evaluations and interventions may be delayed as a result of limited staffing during the pandemic.   [place] Controlled Substance Database was reviewed by me. ____________________________________________   FINAL CLINICAL IMPRESSION(S) / ED DIAGNOSES   Final diagnoses:  Hypoglycemia      NEW MEDICATIONS STARTED DURING THIS VISIT:  ED Discharge Orders     None        Note:  This document was prepared using Dragon voice recognition software and may include unintentional dictation errors.    Nita Sickle, MD 08/07/21 [phone]

## 2021-08-07 NOTE — Discharge Instructions (Addendum)
Continue to hold your metformin until you see your primary care doctor.  If you feel like your sugar is dropping, drink a big glass of orange juice.  If you are out of the house try to carry a chocolate bar or a little bag of sugar with you for any episodes of hypoglycemia.  It is important they follow-up with your primary care doctor for evaluation of her hemoglobin A1c.  Return to the emergency room if your sugar continues to drop, if you have any other symptoms associated with it such as chest pain, shortness of breath, cough, fever, abdominal pain, vomiting, diarrhea, pain with urination.

## 2021-08-14 ENCOUNTER — Emergency Department: Payer: Self-pay

## 2021-08-14 ENCOUNTER — Other Ambulatory Visit: Payer: Self-pay

## 2021-08-14 ENCOUNTER — Emergency Department
Admission: EM | Admit: 2021-08-14 | Discharge: 2021-08-15 | Disposition: A | Discharge status: Home / Self Care | Payer: Self-pay | Attending: Emergency Medicine | Admitting: Emergency Medicine

## 2021-08-14 ENCOUNTER — Encounter: Payer: Self-pay | Admitting: Emergency Medicine

## 2021-08-14 DIAGNOSIS — F17210 Nicotine dependence, cigarettes, uncomplicated: Secondary | ICD-10-CM | POA: Insufficient documentation

## 2021-08-14 DIAGNOSIS — E11649 Type 2 diabetes mellitus with hypoglycemia without coma: Secondary | ICD-10-CM | POA: Insufficient documentation

## 2021-08-14 DIAGNOSIS — E162 Hypoglycemia, unspecified: Secondary | ICD-10-CM

## 2021-08-14 DIAGNOSIS — Z7984 Long term (current) use of oral hypoglycemic drugs: Secondary | ICD-10-CM | POA: Insufficient documentation

## 2021-08-14 DIAGNOSIS — R0789 Other chest pain: Secondary | ICD-10-CM | POA: Insufficient documentation

## 2021-08-14 LAB — BASIC METABOLIC PANEL
Anion gap: 8 (ref 5–15)
BUN: 11 mg/dL (ref 6–20)
CO2: 23 mmol/L (ref 22–32)
Calcium: 9.2 mg/dL (ref 8.9–10.3)
Chloride: 105 mmol/L (ref 98–111)
Creatinine, Ser: 0.83 mg/dL (ref 0.61–1.24)
GFR, Estimated: >60 mL/min (ref >60)
Glucose, Bld: 77 mg/dL (ref 70–99)
Potassium: 3.5 mmol/L (ref 3.5–5.1)
Sodium: 136 mmol/L (ref 135–145)

## 2021-08-14 LAB — CBC
HCT: 41.6 % (ref 39.0–52.0)
Hemoglobin: 14 g/dL (ref 13.0–17.0)
MCH: 28.5 pg (ref 26.0–34.0)
MCHC: 33.7 g/dL (ref 30.0–36.0)
MCV: 84.6 fL (ref 80.0–100.0)
Platelets: 291 10*3/uL (ref 150–400)
RBC: 4.92 MIL/uL (ref 4.22–5.81)
RDW: 12 % (ref 11.5–15.5)
WBC: 4 10*3/uL (ref 4.0–10.5)
nRBC: 0 % (ref 0.0–0.2)

## 2021-08-14 LAB — CBG MONITORING, ED: Glucose-Capillary: 81 mg/dL (ref 70–99)

## 2021-08-14 LAB — TROPONIN I (HIGH SENSITIVITY): Troponin I (High Sensitivity): 4 ng/L (ref <18)

## 2021-08-14 NOTE — ED Notes (Signed)
Call received from first RN as GF is in lobby and asking to visit or give patient his cell phone. This RN notifies patient of such to which he adamantly declines both.   MD at bedside.

## 2021-08-14 NOTE — ED Notes (Signed)
This RN introduced self to patient, placed on monitor, call light in reach, offered TV remote. Pt denies needs at this time. Perseverating on BP as he states it was high at check in and he is upset that it is no longer high.

## 2021-08-14 NOTE — ED Notes (Signed)
After reentering triage room patient states "shit doc you need to check me for STD's, last time I felt like this I got trich from my girl and I think she's still messing around with that guy; check me for everything, trich, syphilous, gonorrhea, HIV."  Pt states some penile discomfort.

## 2021-08-14 NOTE — ED Triage Notes (Signed)
Pt to ED from home c/o left arm/shoulder pain and cramping into left chest several hours ago.  States cramping radiates down left arm to hand, states feels like he's going to pass out and feels hot.  Pt A&Ox4, chest rise even and unlabored, in NAD at this time.

## 2021-08-15 LAB — RPR
RPR Ser Ql: REACTIVE — AB
RPR Titer: 1:8 {titer}

## 2021-08-15 LAB — CHLAMYDIA/NGC RT PCR (ARMC ONLY)
Chlamydia Tr: NOT DETECTED
N gonorrhoeae: NOT DETECTED

## 2021-08-15 NOTE — ED Provider Notes (Signed)
[facility] Emergency Department Provider Note   ____________________________________________   Event Date/Time   First MD Initiated Contact with Patient 08/14/21 2330     (approximate)  I have reviewed the triage vital signs and the nursing notes.   HISTORY  Chief Complaint Chest Pain and Arm Pain    HPI Cameron Watkins is a 50 y.o. male who presents for left-sided chest pain  LOCATION: Left chest DURATION: Occurred earlier today TIMING: Lasted approximately 30 minutes and resolved spontaneously SEVERITY: 10/10 QUALITY: Cramping CONTEXT: Patient states that he was sitting on his couch after dinner and had an episode of cramping left-sided chest wall pain that radiated down into the left arm MODIFYING FACTORS: Denies any exacerbating or relieving factors and specifically not exacerbated with exertion ASSOCIATED SYMPTOMS: Cramping pain down the left arm with no radiation into the jaw or neck   Per medical record review, patient has history of type 2 diabetes          Past Medical History:  Diagnosis Date   Diabetes mellitus without complication [facility])     Patient Active Problem List   Diagnosis Date Noted   Diabetes mellitus without complication (HCC) 10/30/2020    History reviewed. No pertinent surgical history.  Prior to Admission medications   Medication Sig Start Date End Date Taking? Authorizing Provider  metFORMIN (GLUCOPHAGE) 500 MG tablet Take 1 tablet (500 mg total) by mouth 2 (two) times daily with a meal. 05/29/21   Sharyn Creamer, MD    Allergies Bee venom and Penicillins  History reviewed. No pertinent family history.  Social History Social History   Tobacco Use   Smoking status: Every Day    Packs/day: 0.50    Types: Cigarettes   Smokeless tobacco: Never  Substance Use Topics   Alcohol use: Yes    Alcohol/week: 4.0 standard drinks    Types: 4 Shots of liquor per week   Drug use: Yes    Types: Marijuana     Review of Systems Constitutional: No fever/chills Eyes: No visual changes. ENT: No sore throat. Cardiovascular: Endorses chest pain. Respiratory: Denies shortness of breath. Gastrointestinal: No abdominal pain.  No nausea, no vomiting.  No diarrhea. Genitourinary: Negative for dysuria. Musculoskeletal: Negative for acute arthralgias Skin: Negative for rash. Neurological: Negative for headaches, weakness/numbness/paresthesias in any extremity Psychiatric: Negative for suicidal ideation/homicidal ideation   ____________________________________________   PHYSICAL EXAM:  VITAL SIGNS: ED Triage Vitals  Enc Vitals Group     BP 08/14/21 2216 (!) 141/90     Pulse Rate 08/14/21 2216 79     Resp 08/14/21 2216 20     Temp 08/14/21 2216 98.9 F (37.2 C)     Temp Source 08/14/21 2216 Oral     SpO2 08/14/21 2216 97 %     Weight 08/14/21 2216 280 lb (127 kg)     Height 08/14/21 2216 6\' 3"  (1.905 m)     Head Circumference --      Peak Flow --      Pain Score 08/14/21 2216 8     Pain Loc --      Pain Edu? --      Excl. in GC? --    Constitutional: Alert and oriented. Well appearing overweight middle-aged African-American male in no acute distress. Eyes: Conjunctivae are normal. PERRL. Head: Atraumatic. Nose: No congestion/rhinnorhea. Mouth/Throat: Mucous membranes are moist. Neck: No stridor Cardiovascular: Grossly normal heart sounds.  Good peripheral circulation. Respiratory: Normal respiratory effort.  No retractions. Gastrointestinal:  Soft and nontender. No distention. Musculoskeletal: No obvious deformities Neurologic:  Normal speech and language. No gross focal neurologic deficits are appreciated. Skin:  Skin is warm and dry. No rash noted. Psychiatric: Mood and affect are normal. Speech and behavior are normal.  ____________________________________________   LABS (all labs ordered are listed, but only abnormal results are displayed)  Labs Reviewed  CHLAMYDIA/NGC  RT PCR ([facility] ONLY)            BASIC METABOLIC PANEL  CBC  RPR  CBG MONITORING, ED  TROPONIN I (HIGH SENSITIVITY)  TROPONIN I (HIGH SENSITIVITY)   ____________________________________________  EKG  ED ECG REPORT I, Merwyn Katos, the attending physician, personally viewed and interpreted this ECG.  Date: 08/15/2021 EKG Time: 2221 Rate: 80 Rhythm: normal sinus rhythm QRS Axis: normal Intervals: normal ST/T Wave abnormalities: normal Narrative Interpretation: no evidence of acute ischemia  ____________________________________________  RADIOLOGY  ED MD interpretation: 2 view chest x-ray shows no evidence of acute abnormalities including no pneumonia, pneumothorax, or widened mediastinum  Official radiology report(s): DG Chest 2 View  Result Date: 08/14/2021 CLINICAL DATA:  Chest pain EXAM: CHEST - 2 VIEW COMPARISON:  05/29/2021 FINDINGS: The heart size and mediastinal contours are within normal limits. Both lungs are clear. The visualized skeletal structures are unremarkable. IMPRESSION: No active cardiopulmonary disease. Electronically Signed   By: Jasmine Pang M.D.   On: 08/14/2021 23:01    ____________________________________________   PROCEDURES  Procedure(s) performed (including Critical Care):  .1-3 Lead EKG Interpretation  Date/Time: 08/15/2021 12:15 AM Performed by: Merwyn Katos, MD Authorized by: Merwyn Katos, MD     Interpretation: normal     ECG rate:  67   ECG rate assessment: normal     Rhythm: sinus rhythm     Ectopy: none     Conduction: normal     ____________________________________________   INITIAL IMPRESSION / ASSESSMENT AND PLAN / ED COURSE  As part of my medical decision making, I reviewed the following data within the electronic medical record, if available:  Nursing notes reviewed and incorporated, Labs reviewed, EKG interpreted, Old chart reviewed, Radiograph reviewed and Notes from prior ED visits reviewed and incorporated       Workup: ECG, CXR, CBC, BMP, Troponin Findings: ECG: No overt evidence of STEMI. No evidence of Brugadas sign, delta wave, epsilon wave, significantly prolonged QTc, or malignant arrhythmia HS Troponin: Negative x1 Other Labs unremarkable for emergent problems. CXR: Without PTX, PNA, or widened mediastinum Last Stress Test: Never Last Heart Catheterization: Never HEART Score: 2  Given History, Exam, and Workup I have low suspicion for ACS, Pneumothorax, Pneumonia, Pulmonary Embolus, Tamponade, Aortic Dissection or other emergent problem as a cause for this presentation.   Reassesment: Prior to discharge patients pain was controlled and they were well appearing.  Disposition:  Discharge. Strict return precautions discussed with patient with full understanding. Advised patient to follow up promptly with primary care provider      ____________________________________________   FINAL CLINICAL IMPRESSION(S) / ED DIAGNOSES  Final diagnoses:  Other chest pain  Hypoglycemia     ED Discharge Orders     None        Note:  This document was prepared using Dragon voice recognition software and may include unintentional dictation errors.    Merwyn Katos, MD 08/15/21 [phone]

## 2021-08-15 NOTE — ED Notes (Signed)
Per MD, all specimens running in lab. Molli Knock for dispo

## 2021-08-16 ENCOUNTER — Telehealth: Payer: Self-pay | Admitting: Emergency Medicine

## 2021-08-16 NOTE — Telephone Encounter (Signed)
Called patient due to rpr reactive.  Consulted [facility], sent report, and they will review and follow up with patient if needed.  No answer, no voicemail.  Sms sent.

## 2021-08-17 LAB — T.PALLIDUM AB, TOTAL: T Pallidum Abs: REACTIVE — AB

## 2021-10-22 ENCOUNTER — Ambulatory Visit: Payer: Self-pay | Admitting: Physician Assistant

## 2021-10-22 ENCOUNTER — Other Ambulatory Visit: Payer: Self-pay

## 2021-10-22 DIAGNOSIS — Z113 Encounter for screening for infections with a predominantly sexual mode of transmission: Secondary | ICD-10-CM

## 2021-10-22 DIAGNOSIS — Z202 Contact with and (suspected) exposure to infections with a predominantly sexual mode of transmission: Secondary | ICD-10-CM

## 2021-10-22 LAB — GRAM STAIN

## 2021-10-22 MED ORDER — DOXYCYCLINE HYCLATE 100 MG PO TABS: 100.0000 mg | ORAL_TABLET | Freq: Two times a day (BID) | ORAL | 0 refills | Status: AC

## 2021-10-22 MED ORDER — METRONIDAZOLE 500 MG PO TABS: 500.0000 mg | ORAL_TABLET | Freq: Two times a day (BID) | ORAL | 0 refills | Status: AC

## 2021-10-22 NOTE — Progress Notes (Signed)
Pt here for STD screening.  Gram stain results reviewed.  Medication dispensed per Provider orders.  Pt declined condoms. Berdie Ogren, RN

## 2021-10-24 ENCOUNTER — Encounter: Payer: Self-pay | Admitting: Physician Assistant

## 2021-10-24 NOTE — Progress Notes (Signed)
[facility] Department STI clinic/screening visit  Subjective:  Cameron Watkins is a 50 y.o. male being seen today for an STI screening visit. The patient reports they do have symptoms.    Patient has the following medical conditions:   Patient Active Problem List   Diagnosis Date Noted   Diabetes mellitus without complication (HCC) 10/30/2020     Chief Complaint  Patient presents with   SEXUALLY TRANSMITTED DISEASE    Screening, wants all tests possible    HPI  Patient reports that he has had dysuria for 4-5 days.  Reports that he is a contact to Chlamydia and Trich.  Denies surgeries and states that he takes medicines as prescribed for DM and HTN.  States last HIV test was 10/2020 and last void prior to sample collection for Gram stain was over 2 hr ago.  Screening for MPX risk: Does the patient have an unexplained rash? No Is the patient MSM? No Does the patient endorse multiple sex partners or anonymous sex partners? No Did the patient have close or sexual contact with a person diagnosed with MPX? No Has the patient traveled outside the [place] where MPX is endemic? No Is there a high clinical suspicion for MPX-- evidenced by one of the following No  -Unlikely to be chickenpox  -Lymphadenopathy  -Rash that present in same phase of evolution on any given body part   See flowsheet for further details and programmatic requirements.    The following portions of the patient's history were reviewed and updated as appropriate: allergies, current medications, past medical history, past social history, past surgical history and problem list.  Objective:  There were no vitals filed for this visit.  Physical Exam Constitutional:      General: He is not in acute distress.    Appearance: Normal appearance.  HENT:     Head: Normocephalic and atraumatic.     Comments: No nits,lice, or hair loss. No cervical, supraclavicular or axillary adenopathy.     Mouth/Throat:      Mouth: Mucous membranes are moist.     Pharynx: Oropharynx is clear. No oropharyngeal exudate or posterior oropharyngeal erythema.  Eyes:     Conjunctiva/sclera: Conjunctivae normal.  Pulmonary:     Effort: Pulmonary effort is normal.  Abdominal:     Palpations: Abdomen is soft. There is no mass.     Tenderness: There is no abdominal tenderness. There is no guarding or rebound.  Genitourinary:    Penis: Normal.      Testes: Normal.     Comments: Pubic area without nits, lice, hair loss, edema, erythema, lesions and inguinal adenopathy. Penis circumcised without rash, lesions and discharge at meatus. Testicles descended bilaterally,nt, no masses or edema.  Musculoskeletal:     Cervical back: Neck supple. No tenderness.  Skin:    General: Skin is warm and dry.     Findings: No bruising, erythema, lesion or rash.  Neurological:     Mental Status: He is alert and oriented to person, place, and time.  Psychiatric:        Mood and Affect: Mood normal.        Behavior: Behavior normal.        Thought Content: Thought content normal.        Judgment: Judgment normal.      Assessment and Plan:  Cameron Watkins is a 50 y.o. male presenting to the [facility] Department for STI screening  1. Screening for STD (sexually transmitted disease)  Patient into clinic with symptoms. Rec condoms with all sex. Await test results.  Counseled that RN will call if needs to RTC for treatment once results are back.  - Gram stain - HIV [place] [facility] - Syphilis Serology, [place] [facility] - Gonococcus culture  2. Chlamydia contact Treat as a contact to Chlamydia  with Doxycycline 100 mg #14 1 po BID for 7 days. No sex for 14 days and until after partner completes treatment. Call with questions or concerns.  - doxycycline (VIBRA-TABS) 100 MG tablet; Take 1 tablet (100 mg total) by mouth 2 (two) times daily for 7 days.  Dispense: 14 tablet; Refill: 0  3. Trichomonas contact Treat as a  contact to Trich with Metronidazole 500 mg 14 po BID for 7 days with food, no EtOH for 24 hr before and until 72 hr after completing medicine. No sex for 14 days and until after partner completes treatment. Call with questions or concerns.  - metroNIDAZOLE (FLAGYL) 500 MG tablet; Take 1 tablet (500 mg total) by mouth 2 (two) times daily for 7 days.  Dispense: 14 tablet; Refill: 0     No follow-ups on file.  No future appointments.  Matt Holmes, PA

## 2021-10-27 LAB — GONOCOCCUS CULTURE

## 2021-12-01 ENCOUNTER — Emergency Department: Payer: Self-pay

## 2021-12-01 ENCOUNTER — Other Ambulatory Visit: Payer: Self-pay

## 2021-12-01 DIAGNOSIS — R079 Chest pain, unspecified: Secondary | ICD-10-CM | POA: Insufficient documentation

## 2021-12-01 DIAGNOSIS — M545 Low back pain, unspecified: Secondary | ICD-10-CM | POA: Insufficient documentation

## 2021-12-01 DIAGNOSIS — R42 Dizziness and giddiness: Secondary | ICD-10-CM | POA: Insufficient documentation

## 2021-12-01 DIAGNOSIS — M79602 Pain in left arm: Secondary | ICD-10-CM | POA: Insufficient documentation

## 2021-12-01 DIAGNOSIS — Z5321 Procedure and treatment not carried out due to patient leaving prior to being seen by health care provider: Secondary | ICD-10-CM | POA: Insufficient documentation

## 2021-12-01 DIAGNOSIS — E119 Type 2 diabetes mellitus without complications: Secondary | ICD-10-CM | POA: Insufficient documentation

## 2021-12-01 LAB — CBC
HCT: 39.9 % (ref 39.0–52.0)
Hemoglobin: 13.1 g/dL (ref 13.0–17.0)
MCH: 27.8 pg (ref 26.0–34.0)
MCHC: 32.8 g/dL (ref 30.0–36.0)
MCV: 84.7 fL (ref 80.0–100.0)
Platelets: 276 10*3/uL (ref 150–400)
RBC: 4.71 MIL/uL (ref 4.22–5.81)
RDW: 12.4 % (ref 11.5–15.5)
WBC: 4.7 10*3/uL (ref 4.0–10.5)
nRBC: 0 % (ref 0.0–0.2)

## 2021-12-01 LAB — CBG MONITORING, ED: Glucose-Capillary: 141 mg/dL — ABNORMAL HIGH (ref 70–99)

## 2021-12-01 LAB — BASIC METABOLIC PANEL
Anion gap: 5 (ref 5–15)
BUN: 15 mg/dL (ref 6–20)
CO2: 24 mmol/L (ref 22–32)
Calcium: 9.1 mg/dL (ref 8.9–10.3)
Chloride: 108 mmol/L (ref 98–111)
Creatinine, Ser: 0.82 mg/dL (ref 0.61–1.24)
GFR, Estimated: >60 mL/min (ref >60)
Glucose, Bld: 137 mg/dL — ABNORMAL HIGH (ref 70–99)
Potassium: 3.4 mmol/L — ABNORMAL LOW (ref 3.5–5.1)
Sodium: 137 mmol/L (ref 135–145)

## 2021-12-01 LAB — TROPONIN I (HIGH SENSITIVITY): Troponin I (High Sensitivity): 4 ng/L (ref <18)

## 2021-12-01 MED ORDER — ALUM & MAG HYDROXIDE-SIMETH 200-200-20 MG/5ML PO SUSP
30.0000 mL | Freq: Once | ORAL | Status: AC
  Administered 2021-12-01: 30 mL via ORAL

## 2021-12-01 MED ORDER — LIDOCAINE VISCOUS HCL 2 % MT SOLN
15.0000 mL | Freq: Once | OROMUCOSAL | Status: AC
  Administered 2021-12-01: 15 mL via ORAL

## 2021-12-01 NOTE — ED Triage Notes (Signed)
Pt presents to ER c/o chest pain and dizziness that has been off and on for around 1.5 weeks.  Pt states chest feels like its burning.  Pt states pain radiates to left arm and to back. Pt denies any hx of previous MI.  States he does have hx of diabetes.

## 2021-12-02 ENCOUNTER — Emergency Department
Admission: EM | Admit: 2021-12-02 | Discharge: 2021-12-02 | Disposition: A | Discharge status: Home / Self Care | Payer: Self-pay | Attending: Emergency Medicine | Admitting: Emergency Medicine

## 2021-12-02 LAB — TROPONIN I (HIGH SENSITIVITY): Troponin I (High Sensitivity): 3 ng/L (ref <18)

## 2021-12-02 NOTE — ED Notes (Signed)
Observed pt leave through front door lobby.No answer when called for a room.

## 2021-12-14 ENCOUNTER — Emergency Department: Payer: Self-pay

## 2021-12-14 ENCOUNTER — Emergency Department
Admission: EM | Admit: 2021-12-14 | Discharge: 2021-12-15 | Disposition: A | Discharge status: Home / Self Care | Payer: Self-pay | Attending: Emergency Medicine | Admitting: Emergency Medicine

## 2021-12-14 ENCOUNTER — Other Ambulatory Visit: Payer: Self-pay

## 2021-12-14 DIAGNOSIS — R202 Paresthesia of skin: Secondary | ICD-10-CM | POA: Insufficient documentation

## 2021-12-14 DIAGNOSIS — R079 Chest pain, unspecified: Secondary | ICD-10-CM | POA: Insufficient documentation

## 2021-12-14 DIAGNOSIS — R0602 Shortness of breath: Secondary | ICD-10-CM | POA: Insufficient documentation

## 2021-12-14 DIAGNOSIS — M79602 Pain in left arm: Secondary | ICD-10-CM | POA: Insufficient documentation

## 2021-12-14 DIAGNOSIS — Z5321 Procedure and treatment not carried out due to patient leaving prior to being seen by health care provider: Secondary | ICD-10-CM | POA: Insufficient documentation

## 2021-12-14 LAB — CBC
HCT: 42.7 % (ref 39.0–52.0)
Hemoglobin: 13.9 g/dL (ref 13.0–17.0)
MCH: 27.5 pg (ref 26.0–34.0)
MCHC: 32.6 g/dL (ref 30.0–36.0)
MCV: 84.6 fL (ref 80.0–100.0)
Platelets: 281 10*3/uL (ref 150–400)
RBC: 5.05 MIL/uL (ref 4.22–5.81)
RDW: 12.3 % (ref 11.5–15.5)
WBC: 4 10*3/uL (ref 4.0–10.5)
nRBC: 0 % (ref 0.0–0.2)

## 2021-12-14 LAB — BASIC METABOLIC PANEL
Anion gap: 7 (ref 5–15)
BUN: 14 mg/dL (ref 6–20)
CO2: 24 mmol/L (ref 22–32)
Calcium: 9.1 mg/dL (ref 8.9–10.3)
Chloride: 104 mmol/L (ref 98–111)
Creatinine, Ser: 0.94 mg/dL (ref 0.61–1.24)
GFR, Estimated: >60 mL/min (ref >60)
Glucose, Bld: 104 mg/dL — ABNORMAL HIGH (ref 70–99)
Potassium: 3.6 mmol/L (ref 3.5–5.1)
Sodium: 135 mmol/L (ref 135–145)

## 2021-12-14 LAB — CBG MONITORING, ED: Glucose-Capillary: 106 mg/dL — ABNORMAL HIGH (ref 70–99)

## 2021-12-14 LAB — TROPONIN I (HIGH SENSITIVITY): Troponin I (High Sensitivity): 4 ng/L (ref <18)

## 2021-12-14 NOTE — ED Notes (Addendum)
No answer in lobby for repeat vitals x3 °

## 2021-12-14 NOTE — ED Provider Notes (Signed)
Emergency Medicine Provider Triage Evaluation Note  Cameron Watkins , a 50 y.o. male  was evaluated in triage.  Pt complains of chest pain and shortness of breath that started 30 minutes prior to arrival. Also has a burning sensation that runs down left arm.   Review of Systems  Positive: Chest pain, shortness of breath Negative: Diaphoresis, vomiting, nausea  Physical Exam  There were no vitals taken for this visit. Gen:   Awake, no distress   Resp:  Normal effort  MSK:   Moves extremities without difficulty  Other:    Medical Decision Making  Medically screening exam initiated at 6:43 PM.  Appropriate orders placed.  Monish Dhanani was informed that the remainder of the evaluation will be completed by another provider, this initial triage assessment does not replace that evaluation, and the importance of remaining in the ED until their evaluation is complete.    Chinita Pester, FNP 12/14/21 1845    Minna Antis, MD 12/15/21 [phone]

## 2021-12-14 NOTE — ED Triage Notes (Signed)
Pt comes with c/o CP left sided that started about 30 minutes ago. Pt states arm pain and numbness as well. Pt states some SOB.

## 2021-12-19 ENCOUNTER — Emergency Department: Payer: Self-pay

## 2021-12-19 ENCOUNTER — Other Ambulatory Visit: Payer: Self-pay

## 2021-12-19 ENCOUNTER — Emergency Department
Admission: EM | Admit: 2021-12-19 | Discharge: 2021-12-19 | Disposition: A | Discharge status: Home / Self Care | Payer: Self-pay | Attending: Emergency Medicine | Admitting: Emergency Medicine

## 2021-12-19 ENCOUNTER — Encounter: Payer: Self-pay | Admitting: Emergency Medicine

## 2021-12-19 DIAGNOSIS — R0789 Other chest pain: Secondary | ICD-10-CM | POA: Insufficient documentation

## 2021-12-19 DIAGNOSIS — R55 Syncope and collapse: Secondary | ICD-10-CM | POA: Insufficient documentation

## 2021-12-19 DIAGNOSIS — Z5321 Procedure and treatment not carried out due to patient leaving prior to being seen by health care provider: Secondary | ICD-10-CM | POA: Insufficient documentation

## 2021-12-19 DIAGNOSIS — M79602 Pain in left arm: Secondary | ICD-10-CM | POA: Insufficient documentation

## 2021-12-19 LAB — CBC WITH DIFFERENTIAL/PLATELET
Abs Immature Granulocytes: 0.01 10*3/uL (ref 0.00–0.07)
Basophils Absolute: 0 10*3/uL (ref 0.0–0.1)
Basophils Relative: 1 %
Eosinophils Absolute: 0 10*3/uL (ref 0.0–0.5)
Eosinophils Relative: 0 %
HCT: 44 % (ref 39.0–52.0)
Hemoglobin: 14.5 g/dL (ref 13.0–17.0)
Immature Granulocytes: 0 %
Lymphocytes Relative: 60 %
Lymphs Abs: 2.8 10*3/uL (ref 0.7–4.0)
MCH: 27.6 pg (ref 26.0–34.0)
MCHC: 33 g/dL (ref 30.0–36.0)
MCV: 83.7 fL (ref 80.0–100.0)
Monocytes Absolute: 0.6 10*3/uL (ref 0.1–1.0)
Monocytes Relative: 13 %
Neutro Abs: 1.2 10*3/uL — ABNORMAL LOW (ref 1.7–7.7)
Neutrophils Relative %: 26 %
Platelets: 296 10*3/uL (ref 150–400)
RBC: 5.26 MIL/uL (ref 4.22–5.81)
RDW: 12.2 % (ref 11.5–15.5)
WBC: 4.7 10*3/uL (ref 4.0–10.5)
nRBC: 0 % (ref 0.0–0.2)

## 2021-12-19 LAB — COMPREHENSIVE METABOLIC PANEL
ALT: 35 U/L (ref 0–44)
AST: 22 U/L (ref 15–41)
Albumin: 4.3 g/dL (ref 3.5–5.0)
Alkaline Phosphatase: 80 U/L (ref 38–126)
Anion gap: 6 (ref 5–15)
BUN: 16 mg/dL (ref 6–20)
CO2: 25 mmol/L (ref 22–32)
Calcium: 9.3 mg/dL (ref 8.9–10.3)
Chloride: 102 mmol/L (ref 98–111)
Creatinine, Ser: 0.93 mg/dL (ref 0.61–1.24)
GFR, Estimated: >60 mL/min (ref >60)
Glucose, Bld: 87 mg/dL (ref 70–99)
Potassium: 3.3 mmol/L — ABNORMAL LOW (ref 3.5–5.1)
Sodium: 133 mmol/L — ABNORMAL LOW (ref 135–145)
Total Bilirubin: 0.9 mg/dL (ref 0.3–1.2)
Total Protein: 8.3 g/dL — ABNORMAL HIGH (ref 6.5–8.1)

## 2021-12-19 LAB — TROPONIN I (HIGH SENSITIVITY): Troponin I (High Sensitivity): 5 ng/L (ref <18)

## 2021-12-19 NOTE — ED Triage Notes (Signed)
Pt to triage via w/c with no distress noted; pt reports left sided CP radiating into arm with no accomp symptoms; st syncopal episode as well

## 2021-12-24 ENCOUNTER — Emergency Department: Payer: Self-pay

## 2021-12-24 ENCOUNTER — Other Ambulatory Visit: Payer: Self-pay

## 2021-12-24 DIAGNOSIS — Z5321 Procedure and treatment not carried out due to patient leaving prior to being seen by health care provider: Secondary | ICD-10-CM | POA: Insufficient documentation

## 2021-12-24 DIAGNOSIS — R0602 Shortness of breath: Secondary | ICD-10-CM | POA: Insufficient documentation

## 2021-12-24 DIAGNOSIS — R079 Chest pain, unspecified: Secondary | ICD-10-CM | POA: Insufficient documentation

## 2021-12-24 LAB — BASIC METABOLIC PANEL
Anion gap: 6 (ref 5–15)
BUN: 14 mg/dL (ref 6–20)
CO2: 28 mmol/L (ref 22–32)
Calcium: 9.5 mg/dL (ref 8.9–10.3)
Chloride: 101 mmol/L (ref 98–111)
Creatinine, Ser: 0.9 mg/dL (ref 0.61–1.24)
GFR, Estimated: >60 mL/min (ref >60)
Glucose, Bld: 86 mg/dL (ref 70–99)
Potassium: 3.2 mmol/L — ABNORMAL LOW (ref 3.5–5.1)
Sodium: 135 mmol/L (ref 135–145)

## 2021-12-24 LAB — CBC
HCT: 41.7 % (ref 39.0–52.0)
Hemoglobin: 13.5 g/dL (ref 13.0–17.0)
MCH: 27.1 pg (ref 26.0–34.0)
MCHC: 32.4 g/dL (ref 30.0–36.0)
MCV: 83.6 fL (ref 80.0–100.0)
Platelets: 287 10*3/uL (ref 150–400)
RBC: 4.99 MIL/uL (ref 4.22–5.81)
RDW: 11.9 % (ref 11.5–15.5)
WBC: 4.7 10*3/uL (ref 4.0–10.5)
nRBC: 0 % (ref 0.0–0.2)

## 2021-12-24 LAB — TROPONIN I (HIGH SENSITIVITY): Troponin I (High Sensitivity): 3 ng/L (ref <18)

## 2021-12-24 NOTE — ED Provider Triage Note (Addendum)
Emergency Medicine Provider Triage Evaluation Note  Cameron Watkins , a 51 y.o. male  was evaluated in triage.  Pt complains of shortness of breath and chest pain. He has had 2 episodes where he felt he couldn't breath and started to hit himself in the chest because he felt his heart was going to stop. Afterward, he felt dizzy and was burping. Denies cardiac history.  Review of Systems  Positive: Chest pain, shortness of breath Negative: Diaphoresis  Physical Exam  There were no vitals taken for this visit. Gen:   Awake, no distress   Resp:  Normal effort  MSK:   Moves extremities without difficulty  Other:    Medical Decision Making  Medically screening exam initiated at 7:19 PM.  Appropriate orders placed.  Cameron Watkins was informed that the remainder of the evaluation will be completed by another provider, this initial triage assessment does not replace that evaluation, and the importance of remaining in the ED until their evaluation is complete.    Chinita Pester, FNP 12/24/21 1922    Chinita Pester, FNP 12/24/21 1929

## 2021-12-24 NOTE — ED Triage Notes (Signed)
First nurse note: pt to ED [facility] from home, cp, hyperventilations, tingling in fingers. Pt reports his heart stopped and he resuscitated himself . Hx of anxiety.  VSS  Hx DM

## 2021-12-24 NOTE — ED Triage Notes (Signed)
Pt presents via EMS c/o chest pain and SOB x2 episodes prior to arrival. Pt reports had 2 episodes of SOB where had to hit self in chest to catch breathe and dizziness. Reports belching after SOB/chest pain episodes. Talking in full sentences currently. Currently reports burning sensation in chest.

## 2021-12-25 ENCOUNTER — Emergency Department
Admission: EM | Admit: 2021-12-25 | Discharge: 2021-12-25 | Disposition: A | Discharge status: Home / Self Care | Payer: Self-pay | Attending: Emergency Medicine | Admitting: Emergency Medicine

## 2021-12-25 ENCOUNTER — Other Ambulatory Visit: Payer: Self-pay

## 2021-12-25 ENCOUNTER — Emergency Department: Payer: Self-pay

## 2021-12-25 DIAGNOSIS — R079 Chest pain, unspecified: Secondary | ICD-10-CM | POA: Insufficient documentation

## 2021-12-25 DIAGNOSIS — R002 Palpitations: Secondary | ICD-10-CM | POA: Insufficient documentation

## 2021-12-25 DIAGNOSIS — R0602 Shortness of breath: Secondary | ICD-10-CM | POA: Insufficient documentation

## 2021-12-25 HISTORY — DX: Essential (primary) hypertension: I10

## 2021-12-25 LAB — TROPONIN I (HIGH SENSITIVITY)
Troponin I (High Sensitivity): 4 ng/L (ref <18)
Troponin I (High Sensitivity): 3 ng/L (ref <18)

## 2021-12-25 LAB — BASIC METABOLIC PANEL
Anion gap: 7 (ref 5–15)
BUN: 12 mg/dL (ref 6–20)
CO2: 25 mmol/L (ref 22–32)
Calcium: 9 mg/dL (ref 8.9–10.3)
Chloride: 100 mmol/L (ref 98–111)
Creatinine, Ser: 1.08 mg/dL (ref 0.61–1.24)
GFR, Estimated: >60 mL/min (ref >60)
Glucose, Bld: 151 mg/dL — ABNORMAL HIGH (ref 70–99)
Potassium: 3.4 mmol/L — ABNORMAL LOW (ref 3.5–5.1)
Sodium: 132 mmol/L — ABNORMAL LOW (ref 135–145)

## 2021-12-25 LAB — CBC
HCT: 42.9 % (ref 39.0–52.0)
Hemoglobin: 14 g/dL (ref 13.0–17.0)
MCH: 27.5 pg (ref 26.0–34.0)
MCHC: 32.6 g/dL (ref 30.0–36.0)
MCV: 84.1 fL (ref 80.0–100.0)
Platelets: 280 10*3/uL (ref 150–400)
RBC: 5.1 MIL/uL (ref 4.22–5.81)
RDW: 11.9 % (ref 11.5–15.5)
WBC: 3.6 10*3/uL — ABNORMAL LOW (ref 4.0–10.5)
nRBC: 0 % (ref 0.0–0.2)

## 2021-12-25 MED ORDER — LISINOPRIL 10 MG PO TABS: 10.0000 mg | ORAL_TABLET | Freq: Every day | ORAL | 1 refills | Status: DC

## 2021-12-25 MED ORDER — METFORMIN HCL 500 MG PO TABS: 500.0000 mg | ORAL_TABLET | Freq: Two times a day (BID) | ORAL | 1 refills | Status: AC

## 2021-12-25 NOTE — ED Provider Notes (Signed)
[facility] Provider Note    Event Date/Time   First MD Initiated Contact with Patient 12/25/21 1244     (approximate)   History   Chief Complaint Chest Pain and Shortness of Breath   HPI  Ted Ohanesian is a 51 y.o. male with no significant past medical history presents ED complaining of chest pain.  Patient reports that over the past 2 weeks he has been dealing with intermittent episodes of pain in the center of his chest.  He describes this pain as either burning or stabbing, has been occurring on an almost daily basis for the past couple of weeks.  He states he will feels very short of breath and like he cannot get enough air during the episode.  He will sometimes feel like his heart is racing but also states it feels like his heart will briefly stop at times.  He denies any fevers or cough and has not noticed any pain or swelling in his legs.  His most recent episode occurred a couple of hours prior to arrival and symptoms are now improving with minimal pain and no difficulty breathing at this time.     Physical Exam   Triage Vital Signs: ED Triage Vitals  Enc Vitals Group     BP 12/25/21 1148 (!) 141/80     Pulse Rate 12/25/21 1148 81     Resp 12/25/21 1148 20     Temp 12/25/21 1148 98.1 F (36.7 C)     Temp Source 12/25/21 1148 Oral     SpO2 12/25/21 1148 99 %     Weight 12/25/21 1146 279 lb 15.8 oz (127 kg)     Height 12/25/21 1146 6\' 3"  (1.905 m)     Head Circumference --      Peak Flow --      Pain Score 12/25/21 1146 0     Pain Loc --      Pain Edu? --      Excl. in GC? --     Most recent vital signs: Vitals:   12/25/21 1148  BP: (!) 141/80  Pulse: 81  Resp: 20  Temp: 98.1 F (36.7 C)  SpO2: 99%    Constitutional: Alert and oriented. Eyes: Conjunctivae are normal. Head: Atraumatic. Nose: No congestion/rhinnorhea. Mouth/Throat: Mucous membranes are moist.  Cardiovascular: Normal rate, regular rhythm. Grossly normal heart  sounds.  2+ radial pulses bilaterally. Respiratory: Normal respiratory effort.  No retractions. Lungs CTAB. Gastrointestinal: Soft and nontender. No distention. Genitourinary: Deferred. Musculoskeletal: No lower extremity tenderness nor edema.  Neurologic:  Normal speech and language. No gross focal neurologic deficits are appreciated.    ED Results / Procedures / Treatments   Labs (all labs ordered are listed, but only abnormal results are displayed) Labs Reviewed  BASIC METABOLIC PANEL - Abnormal; Notable for the following components:      Result Value   Sodium 132 (*)    Potassium 3.4 (*)    Glucose, Bld 151 (*)    All other components within normal limits  CBC - Abnormal; Notable for the following components:   WBC 3.6 (*)    All other components within normal limits  TROPONIN I (HIGH SENSITIVITY)  TROPONIN I (HIGH SENSITIVITY)     EKG  ED ECG REPORT I, 02/22/22, the attending physician, personally viewed and interpreted this ECG.   Date: 12/25/2021  EKG Time: 11:46  Rate: 85  Rhythm: normal sinus rhythm  Axis: Normal  Intervals:none  ST&T  Change: None  RADIOLOGY Chest x-ray reviewed by me with no infiltrate, edema, or effusion.  PROCEDURES:  Critical Care performed: No  .1-3 Lead EKG Interpretation Performed by: Chesley Noon, MD Authorized by: Chesley Noon, MD     Interpretation: normal     ECG rate:  65-80   ECG rate assessment: normal     Rhythm: sinus rhythm     Ectopy: none     Conduction: normal     MEDICATIONS ORDERED IN ED: Medications - No data to display   IMPRESSION / MDM / ASSESSMENT AND PLAN / ED COURSE  I reviewed the triage vital signs and the nursing notes.                              51 y.o. male with no significant past medical history who presents to the ED complaining of intermittent episodes of stabbing or burning pain in the center of his chest associated with palpitations and difficulty  breathing.  Differential diagnosis includes, but is not limited to, arrhythmia, ACS, PE, dissection, pneumonia, electrolyte abnormality, GERD, and anxiety.  Patient is well-appearing and in no acute distress, states chest pain has almost resolved and he is not currently having any difficulty breathing.  Vital signs are reassuring and with resolving symptoms, doubt PE or dissection.  EKG shows no evidence of arrhythmia or ischemia and initial troponin is negative, given intermittent symptoms we will check repeat troponin.  If repeat is within normal limits, doubt ACS given heart score of less than 4.  Chest x-ray reviewed by me and shows no infiltrate, edema, or effusion.  We will observe on cardiac monitor for any evidence of arrhythmia, but also consider anxiety or GERD as the etiology of his symptoms.  The patient is on the cardiac monitor to evaluate for evidence of arrhythmia and/or significant heart rate changes.  No events noted on cardiac monitor, repeat troponin within normal limits.  Patient is appropriate for outpatient management and will be provided with referral to cardiology as well as to reestablish care with PCP.  He is requesting refill of lisinopril and metformin, which she states he is almost out of.  We will provide 49-month supply and patient counseled to return to the ED for new worsening symptoms, patient agrees with plan.       FINAL CLINICAL IMPRESSION(S) / ED DIAGNOSES   Final diagnoses:  Chest pain, unspecified type  Palpitations     Rx / DC Orders   ED Discharge Orders          Ordered    lisinopril (ZESTRIL) 10 MG tablet  Daily        12/25/21 1443    metFORMIN (GLUCOPHAGE) 500 MG tablet  2 times daily with meals        12/25/21 1443             Note:  This document was prepared using Dragon voice recognition software and may include unintentional dictation errors.   Chesley Noon, MD 12/25/21 [phone]

## 2021-12-25 NOTE — ED Notes (Addendum)
See triage note   presents with a 1 month hx of chest pain  states pain has been intermittent  describes as burning and moves into.left arm  was here last pm but left    states he felt SOB and like his heart was racing

## 2021-12-25 NOTE — ED Triage Notes (Signed)
Pt to ED for shob and chest pain intermittent started a few days ago. Pt states he was told he has anxiety but disagrees. LWBS yesterday.  States when feels shob has to hit self in chest.  NAD noted. RR Even and unlabored

## 2021-12-26 LAB — CBG MONITORING, ED: Glucose-Capillary: 89 mg/dL (ref 70–99)

## 2022-01-09 ENCOUNTER — Ambulatory Visit: Payer: Self-pay

## 2022-02-19 ENCOUNTER — Emergency Department: Payer: Self-pay

## 2022-02-19 ENCOUNTER — Emergency Department
Admission: EM | Admit: 2022-02-19 | Discharge: 2022-02-19 | Disposition: A | Discharge status: Home / Self Care | Payer: Self-pay | Attending: Emergency Medicine | Admitting: Emergency Medicine

## 2022-02-19 ENCOUNTER — Other Ambulatory Visit: Payer: Self-pay

## 2022-02-19 ENCOUNTER — Encounter: Payer: Self-pay | Admitting: Emergency Medicine

## 2022-02-19 DIAGNOSIS — E119 Type 2 diabetes mellitus without complications: Secondary | ICD-10-CM | POA: Insufficient documentation

## 2022-02-19 DIAGNOSIS — I10 Essential (primary) hypertension: Secondary | ICD-10-CM | POA: Insufficient documentation

## 2022-02-19 DIAGNOSIS — Y9301 Activity, walking, marching and hiking: Secondary | ICD-10-CM | POA: Insufficient documentation

## 2022-02-19 DIAGNOSIS — S92332A Displaced fracture of third metatarsal bone, left foot, initial encounter for closed fracture: Secondary | ICD-10-CM | POA: Insufficient documentation

## 2022-02-19 DIAGNOSIS — S92422B Displaced fracture of distal phalanx of left great toe, initial encounter for open fracture: Secondary | ICD-10-CM | POA: Insufficient documentation

## 2022-02-19 DIAGNOSIS — S92402B Displaced unspecified fracture of left great toe, initial encounter for open fracture: Secondary | ICD-10-CM

## 2022-02-19 DIAGNOSIS — W1843XA Slipping, tripping and stumbling without falling due to stepping from one level to another, initial encounter: Secondary | ICD-10-CM | POA: Insufficient documentation

## 2022-02-19 MED ORDER — ACETAMINOPHEN 500 MG PO TABS
ORAL_TABLET | ORAL | Status: AC
  Filled 2022-02-19: qty 1

## 2022-02-19 MED ORDER — MORPHINE SULFATE (PF) 4 MG/ML IV SOLN
6.0000 mg | Freq: Once | INTRAVENOUS | Status: AC
  Administered 2022-02-19: 6 mg via INTRAVENOUS
  Filled 2022-02-19: qty 2

## 2022-02-19 MED ORDER — ACETAMINOPHEN 500 MG PO TABS
1000.0000 mg | ORAL_TABLET | Freq: Once | ORAL | Status: AC
  Administered 2022-02-19: 1000 mg via ORAL
  Filled 2022-02-19: qty 2

## 2022-02-19 MED ORDER — OXYCODONE-ACETAMINOPHEN 5-325 MG PO TABS: 1.0000 | ORAL_TABLET | Freq: Three times a day (TID) | ORAL | 0 refills | Status: AC | PRN

## 2022-02-19 MED ORDER — IBUPROFEN 400 MG PO TABS
400.0000 mg | ORAL_TABLET | Freq: Once | ORAL | Status: AC
  Administered 2022-02-19: 400 mg via ORAL
  Filled 2022-02-19: qty 1

## 2022-02-19 MED ORDER — BACITRACIN ZINC 500 UNIT/GM EX OINT
TOPICAL_OINTMENT | CUTANEOUS | Status: AC
  Filled 2022-02-19: qty 0.9

## 2022-02-19 MED ORDER — MORPHINE SULFATE (PF) 4 MG/ML IV SOLN
4.0000 mg | Freq: Once | INTRAVENOUS | Status: AC
  Administered 2022-02-19: 4 mg via INTRAVENOUS
  Filled 2022-02-19: qty 1

## 2022-02-19 MED ORDER — CLINDAMYCIN HCL 300 MG PO CAPS: 300.0000 mg | ORAL_CAPSULE | Freq: Three times a day (TID) | ORAL | 0 refills | Status: AC

## 2022-02-19 MED ORDER — BACITRACIN ZINC 500 UNIT/GM EX OINT: TOPICAL_OINTMENT | Freq: Two times a day (BID) | CUTANEOUS | Status: DC

## 2022-02-19 MED ORDER — VANCOMYCIN HCL 2000 MG/400ML IV SOLN
2000.0000 mg | Freq: Once | INTRAVENOUS | Status: AC
  Administered 2022-02-19: 2000 mg via INTRAVENOUS
  Filled 2022-02-19: qty 400

## 2022-02-19 MED ORDER — LIDOCAINE HCL (PF) 1 % IJ SOLN
5.0000 mL | Freq: Once | INTRAMUSCULAR | Status: AC
Start: 2022-02-19 — End: 2022-02-19
  Administered 2022-02-19: 5 mL via INTRADERMAL
  Filled 2022-02-19: qty 5

## 2022-02-19 NOTE — ED Notes (Signed)
Cleansed left great toe with NS and bacitracin applied. Non-adherent bandage placed and secured with kerlix. Post-op shoe placed on pt. Pt tolerated well. ?

## 2022-02-19 NOTE — Consult Note (Signed)
PHARMACY -  BRIEF ANTIBIOTIC NOTE  ? ?Pharmacy has received consult(s) for Vancomycin from an ED provider.  The patient's profile has been reviewed for ht/wt/allergies/indication/available labs.   ? ?One time order(s) placed for Vancomycin 2g IV x 1 dose. ? ?Further antibiotics/pharmacy consults should be ordered by admitting physician if indicated.       ?                ?Thank you, ?Hayzlee Mcsorley A Alfie Rideaux ?02/19/2022  7:08 PM ? ?

## 2022-02-19 NOTE — ED Notes (Signed)
Placed a larger size pot-op shoe on left foot and provided pt crutches. Adjusted and discussed proper use, pt demonstrated correct use of crutches. ?

## 2022-02-19 NOTE — ED Triage Notes (Signed)
Pt to ED via POV with c/o complound fracture of left toe. He was walking with his slides and they slipped and heard a pop.Bone is sticking out of left toe.  ?

## 2022-02-19 NOTE — ED Notes (Signed)
MD at the bedside  

## 2022-02-19 NOTE — ED Provider Notes (Signed)
? ?[facility] ?Provider Note ? ? ? Event Date/Time  ? First MD Initiated Contact with Patient 02/19/22 1854   ?  (approximate) ? ? ?History  ? ?No chief complaint on file. ? ? ?HPI ? ?Pearley Surrette is a 51 y.o. male with past medical history of diabetes who presents for evaluation of acute left toe pain and open fracture.  States he was walking down some steps and caught his toe on a stair.  He denies any other pain or injuries including in the heel, knee or hip.  Otherwise has been in his usual state health.  States his last tetanus within the last 5 years.  No other acute concerns at this time. ? ?  ?Past Medical History:  ?Diagnosis Date  ? Diabetes mellitus without complication (HCC)   ? Hypertension   ? ? ? ?Physical Exam  ?Triage Vital Signs: ?ED Triage Vitals  ?Enc Vitals Group  ?   BP 02/19/22 1851 (!) 150/76  ?   Pulse Rate 02/19/22 1851 94  ?   Resp 02/19/22 1851 17  ?   Temp 02/19/22 1851 98.9 ?F (37.2 ?C)  ?   Temp Source 02/19/22 1851 Oral  ?   SpO2 02/19/22 1851 100 %  ?   Weight --   ?   Height 02/19/22 1852 6\' 3"  (1.905 m)  ?   Head Circumference --   ?   Peak Flow --   ?   Pain Score 02/19/22 1852 10  ?   Pain Loc --   ?   Pain Edu? --   ?   Excl. in GC? --   ? ? ?Most recent vital signs: ?Vitals:  ? 02/19/22 2030 02/19/22 2100  ?BP: 138/80 130/80  ?Pulse: 81 78  ?Resp: 18 17  ?Temp:    ?SpO2: 98% 96%  ? ? ?General: Awake, very uncomfortable appearing ?CV:  Good peripheral perfusion.  2+ DP pulses bilaterally. ?Resp:  Normal effort.  ?Abd:  No distention.  ?Other:  Bone is visible at the distal phalangeal joint of the first toe of the left foot.  Approximately 1 cm laceration.  No other obvious trauma to the other toes.  There is some tenderness over the midfoot but no deformity at the ankle. ? ? ?ED Results / Procedures / Treatments  ?Labs ?(all labs ordered are listed, but only abnormal results are displayed) ?Labs Reviewed - No data to  display ? ? ?EKG ? ? ? ?RADIOLOGY ? ?Plain film the left foot on my interpretation shows distal third metatarsal fracture and a fracture involving the distal aspect of left toe with some displacement.  Also reviewed radiology interpretation. ? ?Postreduction plain film shows successful reduction of displaced toe.  I also reviewed radiology interpretation and agree with the findings of same including fracture of the proximal lateral corner of the distal phalanx. ? ? ?PROCEDURES: ? ?Critical Care performed: No ? ?Reduction of dislocation ? ?Date/Time: 02/19/2022 9:14 PM ?Performed by: 04/21/2022, MD ?Authorized by: Gilles Chiquito, MD  ?Consent: Verbal consent obtained. ?Consent given by: patient ?Patient understanding: patient states understanding of the procedure being performed ?Local anesthesia used: yes ?Anesthesia: local infiltration ? ?Anesthesia: ?Local anesthesia used: yes ?Local Anesthetic: lidocaine 1% without epinephrine ?Anesthetic total: 5 mL ? ?Sedation: ?Patient sedated: no ? ?Patient tolerance: patient tolerated the procedure well with no immediate complications ? ? ?Gilles Chiquito.Laceration Repair ? ?Date/Time: 02/19/2022 9:14 PM ?Performed by: 04/21/2022, MD ?Authorized by: Gilles Chiquito,  Zerita Boers, MD  ? ?Consent:  ?  Consent obtained:  Verbal ?  Consent given by:  Patient ?  Risks discussed:  Infection, need for additional repair, pain, poor cosmetic result, poor wound healing, vascular damage and tendon damage ?  Alternatives discussed:  No treatment ?Laceration details:  ?  Location:  Toe ?  Toe location:  R big toe ?  Length (cm):  1 ?Exploration:  ?  Imaging obtained: x-ray   ?  Imaging outcome: foreign body not noted   ?  Wound exploration: wound explored through full range of motion   ?  Contaminated: no   ?Treatment:  ?  Amount of cleaning:  Extensive ?  Irrigation solution:  Sterile water ?  Irrigation method:  Syringe ?  Visualized foreign bodies/material removed: no   ?  Debridement:  None ?   Undermining:  None ?  Scar revision: no   ?Skin repair:  ?  Repair method:  Sutures ?  Suture size:  5-0 ?  Wound skin closure material used: ethylon. ?  Suture technique:  Simple interrupted ?  Number of sutures:  3 ?Approximation:  ?  Approximation:  Close ?Repair type:  ?  Repair type:  Simple ?Post-procedure details:  ?  Dressing:  Antibiotic ointment and bulky dressing ?  Procedure completion:  Tolerated well, no immediate complications ? ? ? ?MEDICATIONS ORDERED IN ED: ?Medications  ?vancomycin (VANCOREADY) IVPB 2000 mg/400 mL (2,000 mg Intravenous New Bag/Given 02/19/22 1950)  ?bacitracin ointment ( Topical Given 02/19/22 2127)  ?morphine (PF) 4 MG/ML injection 6 mg (6 mg Intravenous Given 02/19/22 1922)  ?morphine (PF) 4 MG/ML injection 4 mg (4 mg Intravenous Given 02/19/22 1944)  ?acetaminophen (TYLENOL) tablet 1,000 mg (1,000 mg Oral Given 02/19/22 2041)  ?ibuprofen (ADVIL) tablet 400 mg (400 mg Oral Given 02/19/22 2043)  ?lidocaine (PF) (XYLOCAINE) 1 % injection 5 mL (5 mLs Intradermal Given 02/19/22 2043)  ? ? ? ?IMPRESSION / MDM / ASSESSMENT AND PLAN / ED COURSE  ?I reviewed the triage vital signs and the nursing notes. ?             ?               ? ?Differential diagnosis includes, but is not limited to open joint versus possible underlying fracture.  Patient denies any other injuries or pain and remainder of foot and ankle is unremarkable.  He is otherwise neurovascular intact in the foot. ? ?Plain film the left foot on my interpretation shows distal third metatarsal fracture and a fracture involving the distal aspect of left toe with some displacement.  Also reviewed radiology interpretation. ? ?I discussed with on-call orthopedist Dr. Edmund Hilda G who recommended reduction, washout suture repair and antibiotics.  This was performed per procedure note above.  Successful reduction on postreduction x-ray.  Rx written for analgesia and antibiotics.  Patient placed in postop shoe. ?  ? ? ?FINAL CLINICAL IMPRESSION(S) / ED  DIAGNOSES  ? ?Final diagnoses:  ?Open displaced fracture of phalanx of left great toe, unspecified phalanx, initial encounter  ?Closed displaced fracture of third metatarsal bone of left foot, initial encounter  ? ? ? ?Rx / DC Orders  ? ?ED Discharge Orders   ? ?      Ordered  ?  clindamycin (CLEOCIN) 300 MG capsule  3 times daily       ? 02/19/22 2142  ?  oxyCODONE-acetaminophen (PERCOCET) 5-325 MG tablet  Every 8 hours PRN       ?  02/19/22 2142  ? ?  ?  ? ?  ? ? ? ?Note:  This document was prepared using Dragon voice recognition software and may include unintentional dictation errors. ?  ?Gilles Chiquito, MD ?02/19/22 2143 ? ?

## 2022-03-14 IMAGING — DX DG TOE GREAT 2+V*L*
3 series · 3 of 3 positions shown · non-contrast
Comparison: Earlier same day

CLINICAL DATA: Post reduction

EXAM:
LEFT GREAT TOE

[toe ap]
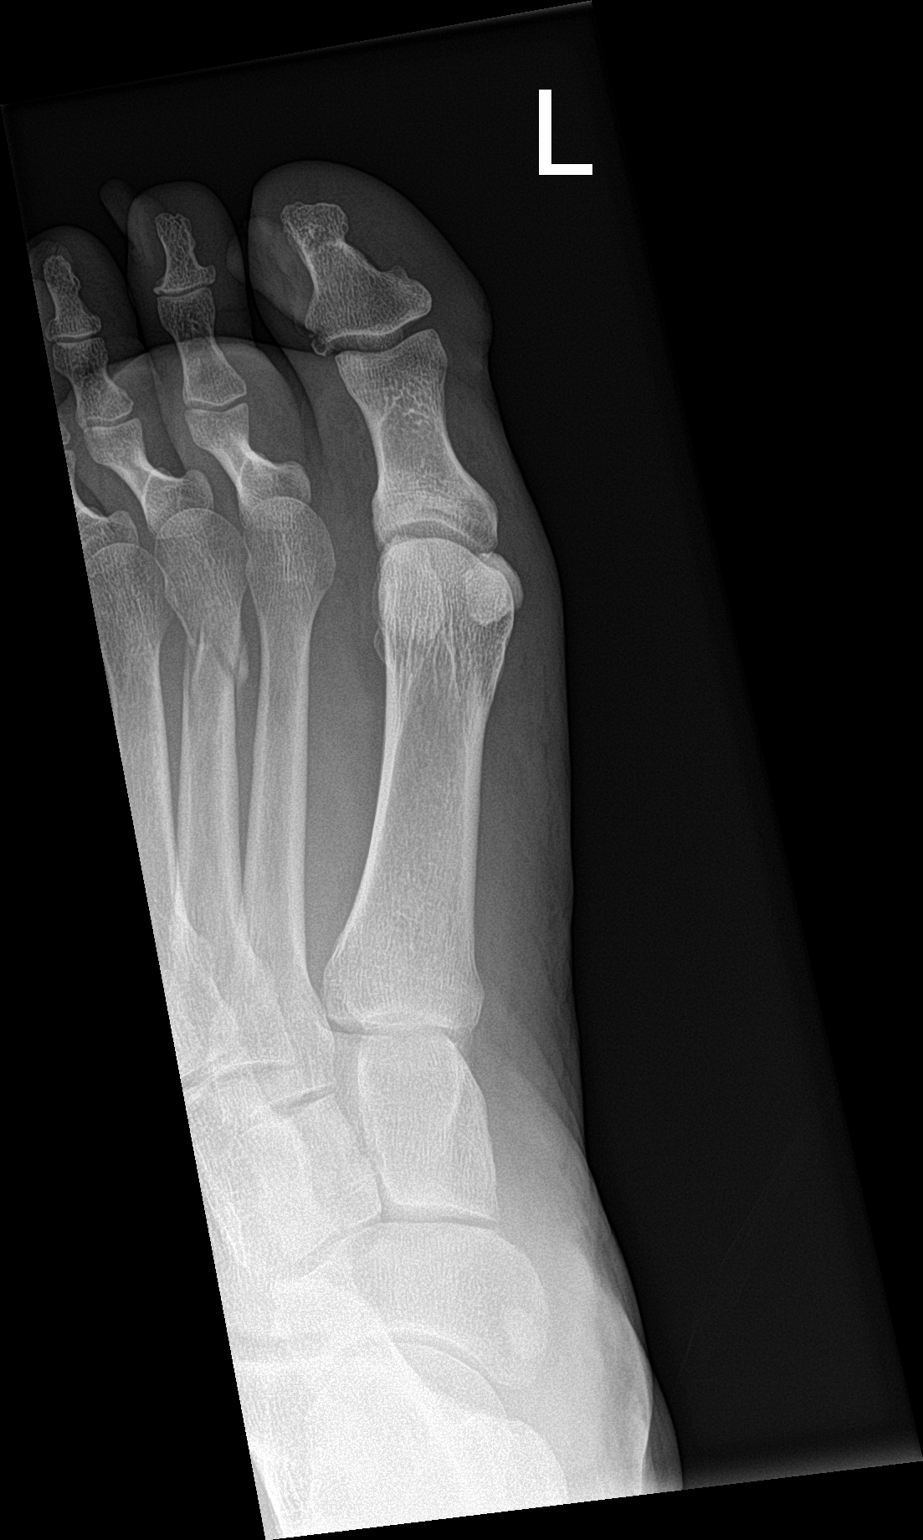

[toe obl]
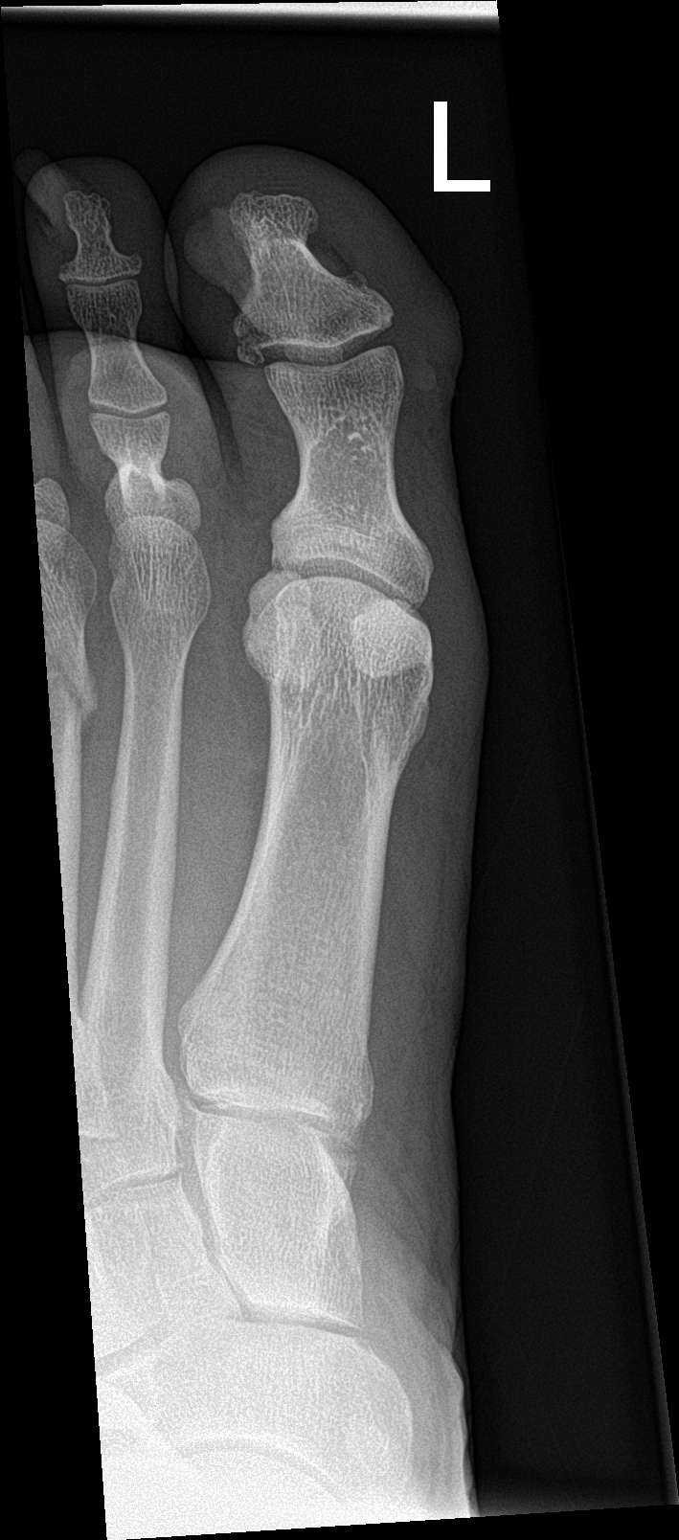

[toe lat]
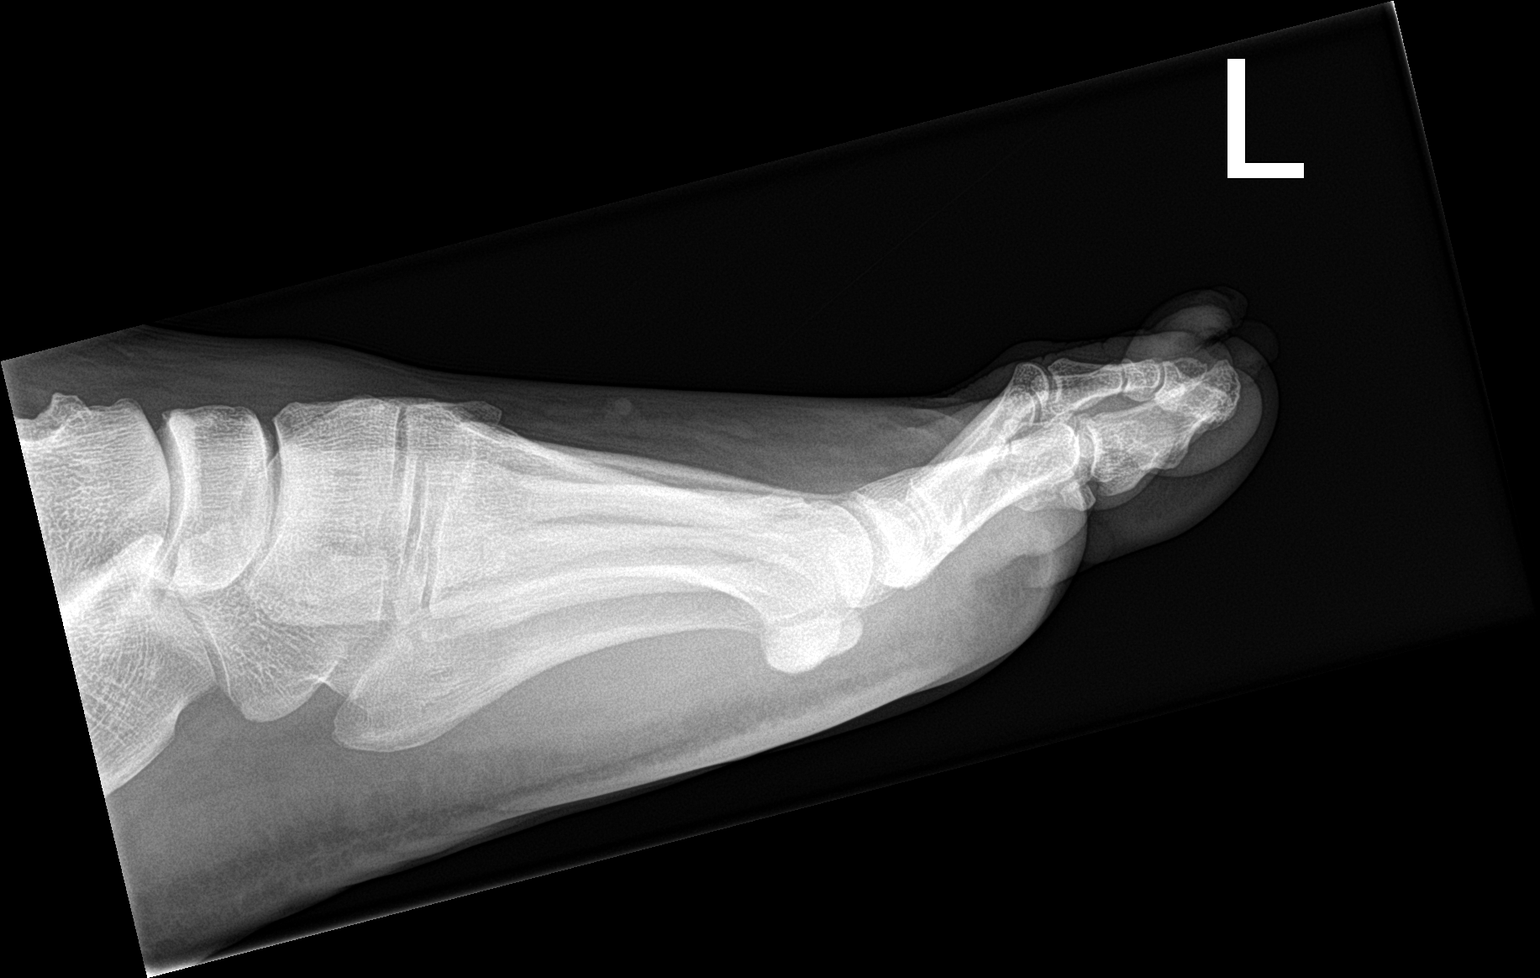

[3 of 3 positions shown; findings below may reference images not displayed]

FINDINGS: There is been reduction of the dislocation at the inter phalangeal
joint of the great toe. Successful relocation. Fracture fragments of
the proximal lateral corner of the distal phalanx are again
demonstrated. Partially visualized is the mildly angulated fracture
of the distal metatarsal of the third toe.
IMPRESSION: Successful reduction of the dislocation at the inter phalangeal
joint of the great toe. Fracture of the proximal lateral corner of
the distal phalanx.

Fracture of the distal metatarsal of the third toe only partially
demonstrated on these films targeted at the great toe.

## 2022-11-13 ENCOUNTER — Ambulatory Visit: Payer: Self-pay | Admitting: Family Medicine

## 2022-11-13 ENCOUNTER — Encounter: Payer: Self-pay | Admitting: Family Medicine

## 2022-11-13 DIAGNOSIS — Z202 Contact with and (suspected) exposure to infections with a predominantly sexual mode of transmission: Secondary | ICD-10-CM

## 2022-11-13 DIAGNOSIS — Z113 Encounter for screening for infections with a predominantly sexual mode of transmission: Secondary | ICD-10-CM

## 2022-11-13 LAB — HM HIV SCREENING LAB: HM HIV Screening: NEGATIVE

## 2022-11-13 LAB — HEPATITIS B SURFACE ANTIGEN: Hepatitis B Surface Ag: NONREACTIVE

## 2022-11-13 MED ORDER — METRONIDAZOLE 500 MG PO TABS: 500.0000 mg | ORAL_TABLET | Freq: Two times a day (BID) | ORAL | 0 refills | Status: AC

## 2022-11-13 NOTE — Progress Notes (Signed)
Pt appointment for STI screening. Pt's partner had just completed her visit. Right at the start of pt's appointment, before being seen by the provider, pt's partner handed him a contact card saying he should be treated for Trichomoniasis. Pt seen by [facility]. Metronidazole dispensed as ordered and instructions given.

## 2022-11-13 NOTE — Progress Notes (Addendum)
[facility] Department STI clinic/screening visit  Subjective:  Cameron Watkins is a 51 y.o. male being seen today for an STI screening visit. The patient reports they do have symptoms.    Patient has the following medical conditions:   Patient Active Problem List   Diagnosis Date Noted   Diabetes mellitus without complication (HCC) 10/30/2020     Chief Complaint  Patient presents with   SEXUALLY TRANSMITTED DISEASE    Screening- burning when urinating     HPI  Patient reports to clinic for burning with urination. Contact to trich.   Last HIV test per patient/review of record was  Lab Results  Component Value Date   HMHIVSCREEN Negative - Validated 10/26/2019   No results found for: "HIV"  Does the patient or their partner desires a pregnancy in the next year? No  Screening for MPX risk: Does the patient have an unexplained rash? No Is the patient MSM? No Does the patient endorse multiple sex partners or anonymous sex partners? No Did the patient have close or sexual contact with a person diagnosed with MPX? No Has the patient traveled outside the [place] where MPX is endemic? No Is there a high clinical suspicion for MPX-- evidenced by one of the following No  -Unlikely to be chickenpox  -Lymphadenopathy  -Rash that present in same phase of evolution on any given body part   See flowsheet for further details and programmatic requirements.    There is no immunization history on file for this patient.   The following portions of the patient's history were reviewed and updated as appropriate: allergies, current medications, past medical history, past social history, past surgical history and problem list.  Objective:  There were no vitals filed for this visit.  Physical Exam Constitutional:      Appearance: Normal appearance.  HENT:     Head: Normocephalic and atraumatic.     Comments: No nits or hair loss    Mouth/Throat:     Mouth: Mucous membranes  are moist. No oral lesions.     Pharynx: Oropharynx is clear. No oropharyngeal exudate or posterior oropharyngeal erythema.  Eyes:     General:        Right eye: No discharge.        Left eye: No discharge.     Conjunctiva/sclera:     Right eye: Right conjunctiva is not injected. No exudate.    Left eye: Left conjunctiva is not injected. No exudate. Pulmonary:     Effort: Pulmonary effort is normal.  Abdominal:     General: Abdomen is flat.     Palpations: Abdomen is soft. There is no hepatomegaly or mass.     Tenderness: There is no abdominal tenderness. There is no rebound.     Hernia: There is no hernia in the left inguinal area or right inguinal area.  Genitourinary:    Pubic Area: No rash or pubic lice (no nits).      Penis: Normal. No tenderness, discharge, swelling or lesions.      Testes: Normal.     Epididymis:     Right: Normal. No mass or tenderness.     Left: Normal. No mass or tenderness.     Rectum: Normal. No tenderness (no lesions or discharge).     Comments: Penile Discharge Amount: none Color:  none Lymphadenopathy:     Head:     Right side of head: No preauricular or posterior auricular adenopathy.     Left side  of head: No preauricular or posterior auricular adenopathy.     Cervical: No cervical adenopathy.     Upper Body:     Right upper body: No supraclavicular, axillary or epitrochlear adenopathy.     Left upper body: No supraclavicular, axillary or epitrochlear adenopathy.     Lower Body: No right inguinal adenopathy. No left inguinal adenopathy.  Skin:    General: Skin is warm and dry.     Findings: No lesion or rash.  Neurological:     Mental Status: He is alert and oriented to person, place, and time.       Assessment and Plan:  Cameron Watkins is a 51 y.o. male presenting to the [facility] Department for STI screening  1. Trichomonas contact  - metroNIDAZOLE (FLAGYL) 500 MG tablet; Take 1 tablet (500 mg total) by mouth 2  (two) times daily for 7 days.  Dispense: 14 tablet; Refill: 0  2. Screening for venereal disease  - Chlamydia/GC NAA, Confirmation - Gonococcus culture -HIV -Syphillis -Hep B  Patient does have STI symptoms Patient accepted all screenings including   Patient meets criteria for HepB screening? Yes. Ordered? yes Patient meets criteria for HepC screening? No. Ordered? no Recommended condom use with all sex Discussed importance of condom use for STI prevent  Treat gram stain per standing order Discussed time line for State Lab results and that patient will be called with positive results and encouraged patient to call if he had not heard in 2 weeks Recommended returning for continued or worsening symptoms.   RTC if symptoms worsen or do not improve.  Total time spent 20 minutes.   Lenice Llamas, [place]

## 2022-11-18 LAB — GONOCOCCUS CULTURE

## 2022-11-18 LAB — CHLAMYDIA/GC NAA, CONFIRMATION
Chlamydia trachomatis, NAA: NEGATIVE
Neisseria gonorrhoeae, NAA: NEGATIVE

## 2022-11-21 ENCOUNTER — Telehealth: Payer: Self-pay

## 2022-11-21 NOTE — Telephone Encounter (Addendum)
Discussed TR and pt's hx of upward trending titers with provider.  Per verbal by Glenna Fellows, FNP, treat pt with Doxycycline 100 mg PO BID x 14 days #28 tablets. Partners will need to be evaluated and tx as indicated.  Phone call to pt at [phone]. Mailbox full, unable to leave message. Tried twice.  MyChart Pending.

## 2022-11-21 NOTE — Telephone Encounter (Addendum)
Call pt re syphilis result from 11/13/22 specimen: RPR = Reactive Titer = 1:32 TP= not done due to hx  Hx: 01/01/22= 1:8 by [facility] Everywhere 10/22/21= 1:16, [facility] providerorder indicates hx of syphilis & treated, no additional syphilis tx needed. 08/14/21= 1:8 ([facility]) 10/30/20= 1:64, TP reactive, Treated with Doxy x 14 days. 10/20/19= Non Reactive PCN ALLERGY  [facility] provider to review TR.  Faxed to [facility].

## 2022-11-25 NOTE — Telephone Encounter (Signed)
Phone call to pt at [phone]. Left message on voicemail that RN with [facility] is calling re TR. Please call Shannen Vernon at [phone].

## 2022-11-27 ENCOUNTER — Ambulatory Visit: Payer: Self-pay

## 2022-11-27 NOTE — Telephone Encounter (Signed)
Phone call to pt at [phone]. Left message on voicemail that RN with [facility] is calling re TR. Please call Wilfred Siverson at [phone].  My Chart Pending.  Phone call to alternative number listed in pt chart, [phone]. Male answered phone and stated she was his mother and would pass a message along to him. "Please call Madeline Bebout at [phone]."

## 2022-11-27 NOTE — Telephone Encounter (Addendum)
Received return call from pt. Pt confirmed password. Provided new phone number [phone], previous phone broken. Discussed syphilis result and provider order based on rising titers. Pt states that he does not believe his and partners treatments were completed before they were sexually active again and they both need to be treated. Pt brought his partner into conversation and partner stated she would like an appt to be evaluated and treated as indicated by provider. Both would like to be evaluated, treated, counseled as needed on same day.  Provider appt scheduled for 12/02/22.

## 2022-11-29 ENCOUNTER — Encounter: Payer: Self-pay | Admitting: Family Medicine

## 2022-11-29 DIAGNOSIS — A539 Syphilis, unspecified: Secondary | ICD-10-CM | POA: Insufficient documentation

## 2022-11-29 NOTE — Telephone Encounter (Signed)
See scanned 11/13/22 TR with written provider orders by Glenna Fellows, FNP for: Doxycycline 100mg  PO BID x 14 days  Provider appt 12/02/22. Partner also has appt for provider evaluation/tx.

## 2022-12-02 ENCOUNTER — Ambulatory Visit: Payer: Self-pay | Admitting: Family Medicine

## 2022-12-02 ENCOUNTER — Encounter: Payer: Self-pay | Admitting: Family Medicine

## 2022-12-02 DIAGNOSIS — A539 Syphilis, unspecified: Secondary | ICD-10-CM

## 2022-12-02 MED ORDER — DOXYCYCLINE HYCLATE 100 MG PO TABS: 100.0000 mg | ORAL_TABLET | Freq: Two times a day (BID) | ORAL | 0 refills | Status: AC

## 2022-12-02 NOTE — Progress Notes (Signed)
When the doxycycline that was dispensed was charted in the sample med section, it was written that only 14 tablets were dispensed. This is incorrect. 28 tablets were dispensed to pt as per order.

## 2022-12-02 NOTE — Progress Notes (Signed)
Pt appointment for Syphilis treatment. Pt seen by FNP Sydnee Levans. Treatment dispensed as ordered and instructions provided.

## 2022-12-02 NOTE — Progress Notes (Signed)
[facility] Department STI clinic/screening visit  Subjective:  Cameron Watkins is a 51 y.o. male being seen today for an STI treatment for syphilis.   Patient has the following medical conditions:   Patient Active Problem List   Diagnosis Date Noted   Syphilis 11/29/2022   Diabetes mellitus without complication (HCC) 10/30/2020     Chief Complaint  Patient presents with   SEXUALLY TRANSMITTED DISEASE    Treatment for syphilis     HPI  Patient reports  to clinic for treatment for syphilis.   Last HIV test per patient/review of record was  Lab Results  Component Value Date   HMHIVSCREEN Negative - Validated 11/13/2022   No results found for: "HIV"  Does the patient or their partner desires a pregnancy in the next year? No  Screening for MPX risk: Does the patient have an unexplained rash? No Is the patient MSM? No Does the patient endorse multiple sex partners or anonymous sex partners? No Did the patient have close or sexual contact with a person diagnosed with MPX? No Has the patient traveled outside the [place] where MPX is endemic? No Is there a high clinical suspicion for MPX-- evidenced by one of the following No  -Unlikely to be chickenpox  -Lymphadenopathy  -Rash that present in same phase of evolution on any given body part   See flowsheet for further details and programmatic requirements.    There is no immunization history on file for this patient.   The following portions of the patient's history were reviewed and updated as appropriate: allergies, current medications, past medical history, past social history, past surgical history and problem list.  Objective:  There were no vitals filed for this visit.  Physical Exam Constitutional:      Appearance: Normal appearance.  HENT:     Head: Normocephalic.  Pulmonary:     Effort: Pulmonary effort is normal.  Neurological:     Mental Status: He is alert and oriented to person, place, and  time.  Psychiatric:        Mood and Affect: Mood normal.        Behavior: Behavior normal.       Assessment and Plan:  Cameron Watkins is a 51 y.o. male presenting to the [facility] Department for STI screening  1. Syphilis Patient had positive syphilis titer of 1:32 on 11/29. States his last sex with his wife was this morning. States they did not have sex before Dec 13. Recently seen here on 11/29, treated as a contact to trich. No concerns today- asymptomatic.Repeat testing not indicated.   -Treatment with doxycyline 100mg  BID x 14 days.   Patient does not have STI symptoms Patient accepted all screenings including   Patient meets criteria for HepB screening? No. Ordered? not applicable Patient meets criteria for HepC screening? No. Ordered? not applicable Recommended condom use with all sex Discussed importance of condom use for STI prevent  Treat gram stain per standing order Discussed time line for State Lab results and that patient will be called with positive results and encouraged patient to call if he had not heard in 2 weeks Recommended returning for continued or worsening symptoms.   Return if symptoms worsen or fail to improve.  Total time spent 20 minutes.   , Lenice Llamas

## 2022-12-18 ENCOUNTER — Encounter: Payer: Self-pay | Admitting: *Deleted

## 2022-12-18 ENCOUNTER — Emergency Department: Payer: Self-pay

## 2022-12-18 ENCOUNTER — Other Ambulatory Visit: Payer: Self-pay

## 2022-12-18 DIAGNOSIS — Z7984 Long term (current) use of oral hypoglycemic drugs: Secondary | ICD-10-CM | POA: Insufficient documentation

## 2022-12-18 DIAGNOSIS — Z79899 Other long term (current) drug therapy: Secondary | ICD-10-CM | POA: Insufficient documentation

## 2022-12-18 DIAGNOSIS — K8020 Calculus of gallbladder without cholecystitis without obstruction: Secondary | ICD-10-CM | POA: Insufficient documentation

## 2022-12-18 DIAGNOSIS — Z7982 Long term (current) use of aspirin: Secondary | ICD-10-CM | POA: Insufficient documentation

## 2022-12-18 DIAGNOSIS — R079 Chest pain, unspecified: Secondary | ICD-10-CM | POA: Insufficient documentation

## 2022-12-18 DIAGNOSIS — E119 Type 2 diabetes mellitus without complications: Secondary | ICD-10-CM | POA: Insufficient documentation

## 2022-12-18 DIAGNOSIS — E876 Hypokalemia: Secondary | ICD-10-CM | POA: Insufficient documentation

## 2022-12-18 DIAGNOSIS — I10 Essential (primary) hypertension: Secondary | ICD-10-CM | POA: Insufficient documentation

## 2022-12-18 DIAGNOSIS — Z1152 Encounter for screening for COVID-19: Secondary | ICD-10-CM | POA: Insufficient documentation

## 2022-12-18 LAB — CBC
HCT: 38.9 % — ABNORMAL LOW (ref 39.0–52.0)
Hemoglobin: 12.5 g/dL — ABNORMAL LOW (ref 13.0–17.0)
MCH: 27.2 pg (ref 26.0–34.0)
MCHC: 32.1 g/dL (ref 30.0–36.0)
MCV: 84.7 fL (ref 80.0–100.0)
Platelets: 268 10*3/uL (ref 150–400)
RBC: 4.59 MIL/uL (ref 4.22–5.81)
RDW: 13.2 % (ref 11.5–15.5)
WBC: 5.2 10*3/uL (ref 4.0–10.5)
nRBC: 0 % (ref 0.0–0.2)

## 2022-12-18 LAB — BASIC METABOLIC PANEL
Anion gap: 9 (ref 5–15)
BUN: 14 mg/dL (ref 6–20)
CO2: 23 mmol/L (ref 22–32)
Calcium: 8.6 mg/dL — ABNORMAL LOW (ref 8.9–10.3)
Chloride: 105 mmol/L (ref 98–111)
Creatinine, Ser: 0.94 mg/dL (ref 0.61–1.24)
GFR, Estimated: >60 mL/min (ref >60)
Glucose, Bld: 143 mg/dL — ABNORMAL HIGH (ref 70–99)
Potassium: 3.1 mmol/L — ABNORMAL LOW (ref 3.5–5.1)
Sodium: 137 mmol/L (ref 135–145)

## 2022-12-18 LAB — HEPATIC FUNCTION PANEL
ALT: 51 U/L — ABNORMAL HIGH (ref 0–44)
AST: 29 U/L (ref 15–41)
Albumin: 3.5 g/dL (ref 3.5–5.0)
Alkaline Phosphatase: 78 U/L (ref 38–126)
Bilirubin, Direct: 0.1 mg/dL (ref 0.0–0.2)
Indirect Bilirubin: 0.7 mg/dL (ref 0.3–0.9)
Total Bilirubin: 0.8 mg/dL (ref 0.3–1.2)
Total Protein: 7.2 g/dL (ref 6.5–8.1)

## 2022-12-18 LAB — LIPASE, BLOOD: Lipase: 53 U/L — ABNORMAL HIGH (ref 11–51)

## 2022-12-18 LAB — TROPONIN I (HIGH SENSITIVITY): Troponin I (High Sensitivity): 5 ng/L (ref <18)

## 2022-12-18 NOTE — ED Triage Notes (Signed)
Pt has chest pain, sob since this am.  Pt also reports belching. Pt states he feels like he is going to pass out.  Cig smoker.  No cough   no fever  pt alert  speech clear.

## 2022-12-18 NOTE — Telephone Encounter (Signed)
Pt evaluated by [facility] provider. Doxycycline 100 mg PO BID x 14 days dispensed on 12/02/22.

## 2022-12-19 ENCOUNTER — Emergency Department: Payer: Self-pay

## 2022-12-19 ENCOUNTER — Emergency Department
Admission: EM | Admit: 2022-12-19 | Discharge: 2022-12-19 | Disposition: A | Discharge status: Home / Self Care | Payer: Self-pay | Attending: Emergency Medicine | Admitting: Emergency Medicine

## 2022-12-19 DIAGNOSIS — E876 Hypokalemia: Secondary | ICD-10-CM

## 2022-12-19 DIAGNOSIS — K8020 Calculus of gallbladder without cholecystitis without obstruction: Secondary | ICD-10-CM

## 2022-12-19 DIAGNOSIS — R079 Chest pain, unspecified: Secondary | ICD-10-CM

## 2022-12-19 DIAGNOSIS — R1013 Epigastric pain: Secondary | ICD-10-CM

## 2022-12-19 DIAGNOSIS — K8050 Calculus of bile duct without cholangitis or cholecystitis without obstruction: Secondary | ICD-10-CM

## 2022-12-19 LAB — RESP PANEL BY RT-PCR (RSV, FLU A&B, COVID)  RVPGX2
Influenza A by PCR: NEGATIVE
Influenza B by PCR: NEGATIVE
Resp Syncytial Virus by PCR: NEGATIVE
SARS Coronavirus 2 by RT PCR: NEGATIVE

## 2022-12-19 LAB — T4, FREE: Free T4: 0.9 ng/dL (ref 0.61–1.12)

## 2022-12-19 LAB — TROPONIN I (HIGH SENSITIVITY): Troponin I (High Sensitivity): 4 ng/L (ref <18)

## 2022-12-19 LAB — TSH: TSH: 1.574 u[IU]/mL (ref 0.350–4.500)

## 2022-12-19 MED ORDER — POTASSIUM CHLORIDE CRYS ER 20 MEQ PO TBCR
40.0000 meq | EXTENDED_RELEASE_TABLET | Freq: Once | ORAL | Status: AC
  Administered 2022-12-19: 40 meq via ORAL
  Filled 2022-12-19: qty 2

## 2022-12-19 MED ORDER — HYDROMORPHONE HCL 1 MG/ML IJ SOLN
0.5000 mg | Freq: Once | INTRAMUSCULAR | Status: AC
  Administered 2022-12-19: 0.5 mg via INTRAVENOUS
  Filled 2022-12-19: qty 0.5

## 2022-12-19 MED ORDER — ONDANSETRON HCL 4 MG/2ML IJ SOLN
4.0000 mg | Freq: Once | INTRAMUSCULAR | Status: AC
  Administered 2022-12-19: 4 mg via INTRAVENOUS
  Filled 2022-12-19: qty 2

## 2022-12-19 MED ORDER — OXYCODONE-ACETAMINOPHEN 5-325 MG PO TABS
1.0000 | ORAL_TABLET | Freq: Once | ORAL | Status: AC
  Administered 2022-12-19: 1 via ORAL
  Filled 2022-12-19: qty 1

## 2022-12-19 MED ORDER — SODIUM CHLORIDE 0.9 % IV BOLUS
1000.0000 mL | Freq: Once | INTRAVENOUS | Status: AC
  Administered 2022-12-19: 1000 mL via INTRAVENOUS

## 2022-12-19 MED ORDER — ONDANSETRON 4 MG PO TBDP: 4.0000 mg | ORAL_TABLET | Freq: Three times a day (TID) | ORAL | 0 refills | Status: AC | PRN

## 2022-12-19 MED ORDER — OXYCODONE-ACETAMINOPHEN 5-325 MG PO TABS: 1.0000 | ORAL_TABLET | ORAL | 0 refills | Status: AC | PRN

## 2022-12-19 MED ORDER — FAMOTIDINE IN NACL 20-0.9 MG/50ML-% IV SOLN
20.0000 mg | Freq: Once | INTRAVENOUS | Status: AC
  Administered 2022-12-19: 20 mg via INTRAVENOUS
  Filled 2022-12-19: qty 50

## 2022-12-19 NOTE — ED Provider Notes (Signed)
[facility] Provider Note    Event Date/Time   First MD Initiated Contact with Patient 12/19/22 0106     (approximate)   History   Chest Pain   HPI  Cameron Watkins is a 52 y.o. male who presents to the ED from home with a chief complaint of chest pain and shortness of breath.  Patient was at rest, watching TV after eating a dinner of hamburger helper when he began to belch and have epigastric discomfort.  States he belch so much he became short of breath and felt like he was going to pass out.  Endorses associated palpitations without skipping beats.  Symptoms improved.  Denies recent fever, cough, nausea, vomiting, palpitations or dizziness.  Denies recent travel, trauma or hormone use.  Spouse states it seems like he has been getting reflux more often lately.     Past Medical History   Past Medical History:  Diagnosis Date   Diabetes mellitus without complication ([place])    Hypertension      Active Problem List   Patient Active Problem List   Diagnosis Date Noted   Syphilis 11/29/2022   Diabetes mellitus without complication ([place]) 40/09/2724     Past Surgical History  History reviewed. No pertinent surgical history.   Home Medications   Prior to Admission medications   Medication Sig Start Date End Date Taking? Authorizing Provider  aspirin EC 81 MG tablet Take 81 mg by mouth daily. Swallow whole.    [provider]  lisinopril (ZESTRIL) 10 MG tablet Take 1 tablet (10 mg total) by mouth daily. 12/25/21 02/23/22  Blake Divine, MD  metFORMIN (GLUCOPHAGE) 500 MG tablet Take 1 tablet (500 mg total) by mouth 2 (two) times daily with a meal. 12/25/21 02/23/22  Blake Divine, MD  metoprolol succinate (TOPROL-XL) 50 MG 24 hr tablet Take 50 mg by mouth daily. 02/04/22   [provider]     Allergies  Green dye, Bee venom, and Penicillins   Family History  History reviewed. No pertinent family history.   Physical Exam   Triage Vital Signs: ED Triage Vitals  Enc Vitals Group     BP 12/18/22 2153 (!) 143/77     Pulse Rate 12/18/22 2153 80     Resp 12/18/22 2153 18     Temp 12/18/22 2153 98.2 F (36.8 C)     Temp Source 12/18/22 2153 Oral     SpO2 12/18/22 2153 94 %     Weight 12/18/22 2145 293 lb (132.9 kg)     Height 12/18/22 2145 6\' 3"  (1.905 m)     Head Circumference --      Peak Flow --      Pain Score 12/18/22 2145 6     Pain Loc --      Pain Edu? --      Excl. in [place]? --     Updated Vital Signs: BP (!) 149/70   Pulse 64   Temp 98.1 F (36.7 C) (Oral)   Resp 17   Ht 6\' 3"  (1.905 m)   Wt 132.9 kg   SpO2 97%   BMI 36.62 kg/m    General: Awake, no distress.  CV:  RRR.  Good peripheral perfusion.  Resp:  Normal effort.  CTAB. Abd:  Mild epigastric tenderness to palpation without rebound or guarding.  No distention.  Other:  No truncal vesicles.  Bilateral calves are supple and nontender.   ED Results / Procedures / Treatments  Labs (  all labs ordered are listed, but only abnormal results are displayed) Labs Reviewed  BASIC METABOLIC PANEL - Abnormal; Notable for the following components:      Result Value   Potassium 3.1 (*)    Glucose, Bld 143 (*)    Calcium 8.6 (*)    All other components within normal limits  CBC - Abnormal; Notable for the following components:   Hemoglobin 12.5 (*)    HCT 38.9 (*)    All other components within normal limits  HEPATIC FUNCTION PANEL - Abnormal; Notable for the following components:   ALT 51 (*)    All other components within normal limits  LIPASE, BLOOD - Abnormal; Notable for the following components:   Lipase 53 (*)    All other components within normal limits  RESP PANEL BY RT-PCR (RSV, FLU A&B, COVID)  [id number]  TSH  T4, FREE  TROPONIN I (HIGH SENSITIVITY)  TROPONIN I (HIGH SENSITIVITY)     EKG  ED ECG REPORT I, Hetal Proano J, the attending physician, personally viewed and interpreted this ECG.   Date: 12/19/2022  EKG  Time: 2148  Rate: 85  Rhythm: normal sinus rhythm  Axis: Normal  Intervals:none  ST&T Change: Nonspecific    RADIOLOGY I have independently visualized and interpreted patient's x-ray and ultrasound as well as noted the radiology interpretation:  X-ray: No acute cardiopulmonary process  Ultrasound: Scattered gallstones  Official radiology report(s): US ABDOMEN LIMITED RUQ (LIVER/GB)  Result Date: 12/19/2022 CLINICAL DATA:  Epigastric pain EXAM: ULTRASOUND ABDOMEN LIMITED RIGHT UPPER QUADRANT COMPARISON:  None Available. FINDINGS: Gallbladder: Gallbladder is contracted with gallstones within. No pericholecystic fluid is noted. Negative sonographic Murphy's sign is noted. Gallbladder wall is thickened although related to the decompressed state. Common bile duct: Diameter: 3.3 mm. Liver: Increased in echogenicity consistent with fatty infiltration. No focal mass is noted. Portal vein is patent on color Doppler imaging with normal direction of blood flow towards the liver. Other: None. IMPRESSION: Fatty liver. Decompressed gallbladder with scattered gallstones. Electronically Signed   By: Inez Catalina M.D.   On: 12/19/2022 02:27   DG Chest 1 View  Result Date: 12/18/2022 CLINICAL DATA:  Chest pain and shortness of breath. EXAM: CHEST  1 VIEW COMPARISON:  December 25, 2021 FINDINGS: The heart size and mediastinal contours are within normal limits. Both lungs are clear. The visualized skeletal structures are unremarkable. IMPRESSION: No active disease. Electronically Signed   By: Virgina Norfolk M.D.   On: 12/18/2022 22:31     PROCEDURES:  Critical Care performed: No  .1-3 Lead EKG Interpretation  Performed by: Paulette Blanch, MD Authorized by: Paulette Blanch, MD     Interpretation: normal     ECG rate:  85   ECG rate assessment: normal     Rhythm: sinus rhythm     Ectopy: none     Conduction: normal   Comments:     Patient placed on cardiac monitor to evaluate for  arrhythmias    MEDICATIONS ORDERED IN ED: Medications  potassium chloride SA (KLOR-CON M) CR tablet 40 mEq (has no administration in time range)  sodium chloride 0.9 % bolus 1,000 mL (1,000 mLs Intravenous New Bag/Given 12/19/22 0230)  ondansetron (ZOFRAN) injection 4 mg (4 mg Intravenous Given 12/19/22 0231)  HYDROmorphone (DILAUDID) injection 0.5 mg (0.5 mg Intravenous Given 12/19/22 0231)  famotidine (PEPCID) IVPB 20 mg premix (0 mg Intravenous Stopped 12/19/22 0259)     IMPRESSION / MDM / ASSESSMENT AND PLAN / ED COURSE  I reviewed the triage vital signs and the nursing notes.                             52 year old male presenting with chest/epigastric discomfort with belching. Differential diagnosis includes, but is not limited to, ACS, aortic dissection, pulmonary embolism, cardiac tamponade, pneumothorax, pneumonia, pericarditis, myocarditis, GI-related causes including esophagitis/gastritis, and musculoskeletal chest wall pain.   I have personally reviewed patient's records and note a PCP office visit on 12/02/2022 for syphilis.  Patient's presentation is most consistent with acute presentation with potential threat to life or bodily function.  The patient is on the cardiac monitor to evaluate for evidence of arrhythmia and/or significant heart rate changes.  Laboratory results thus far demonstrate normal WBC, mild hypokalemia with potassium 3.1, normal LFTs, minimally elevated lipase.  Initial troponin, EKG and chest x-ray are unremarkable.  Will repeat troponin, obtain right upper quadrant abdominal ultrasound.  Administer IV fluid hydration, IV Dilaudid for pain.  With IV Zofran for nausea, IV Pepcid for GERD and reassess.  Clinical Course as of 12/19/22 0353  Thu Dec 19, 2022  4098 Patient feeling better.  Will replete potassium orally.  Updated patient and spouse on ultrasound results.  Will discharge home with as needed Percocet/Zofran and general surgery follow-up.  Strict return  precautions given.  Both verbalized understanding and agree with plan of care. [JS]    Clinical Course User Index [JS] Irean Hong, MD     FINAL CLINICAL IMPRESSION(S) / ED DIAGNOSES   Final diagnoses:  Nonspecific chest pain  Epigastric pain  Hypokalemia  Calculus of gallbladder without cholecystitis without obstruction  Biliary colic     Rx / DC Orders   ED Discharge Orders     None        Note:  This document was prepared using Dragon voice recognition software and may include unintentional dictation errors.   Irean Hong, MD 12/19/22 [phone]

## 2022-12-19 NOTE — Discharge Instructions (Signed)
1. Take medicines as needed for pain & nausea (Percocet/Zofran #30). 2. Clear liquids x 12 hours, then bland diet x 1 week, then slowly advance diet as tolerated. Avoid fatty, greasy, spicy foods and drinks. 3. Return to the ER for worsening symptoms, persistent vomiting, fever, difficulty breathing or other concerns.  

## 2022-12-19 NOTE — ED Notes (Signed)
US at bedside

## 2022-12-19 NOTE — ED Notes (Signed)
US in room 

## 2022-12-26 ENCOUNTER — Ambulatory Visit: Payer: Self-pay | Admitting: Surgery

## 2022-12-26 ENCOUNTER — Telehealth: Payer: Self-pay | Admitting: Surgery

## 2022-12-26 ENCOUNTER — Other Ambulatory Visit: Payer: Self-pay

## 2022-12-26 ENCOUNTER — Encounter: Payer: Self-pay | Admitting: Surgery

## 2022-12-26 VITALS — BP 132/88 | HR 93 | Temp 98.5°F | Ht 75.25 in | Wt 280.0 lb

## 2022-12-26 DIAGNOSIS — K8010 Calculus of gallbladder with chronic cholecystitis without obstruction: Secondary | ICD-10-CM | POA: Insufficient documentation

## 2022-12-26 NOTE — Patient Instructions (Addendum)
Our surgery scheduler will call you within 24-48 hours to schedule your surgery. Please have the blue surgery sheet available when speaking with her.   Gallbladder Eating Plan High blood cholesterol, obesity, a sedentary lifestyle, an unhealthy diet, and diabetes are risk factors for developing gallstones. If you have a gallbladder condition, you may have trouble digesting fats and tolerating high fat intake. Eating a low-fat diet can help reduce your symptoms and may be helpful before and after having surgery to remove your gallbladder (cholecystectomy). Your health care provider may recommend that you work with a dietitian to help you reduce the amount of fat in your diet. What are tips for following this plan? General guidelines Limit your fat intake to less than 30% of your total daily calories. If you eat around 1,800 calories each day, this means eating less than 60 grams (g) of fat per day. Fat is an important part of a healthy diet. Eating a low-fat diet can make it hard to maintain a healthy body weight. Ask your dietitian how much fat, calories, and other nutrients you need each day. Eat small, frequent meals throughout the day instead of three large meals. Drink at least 8-10 cups (1.9-2.4 L) of fluid a day. Drink enough fluid to keep your urine pale yellow. If you drink alcohol: Limit how much you have to: 0-1 drink a day for women who are not pregnant. 0-2 drinks a day for men. Know how much alcohol is in a drink. In the [place] one drink equals one 12 oz bottle of beer (355 mL), one 5 oz glass of wine (148 mL), or one 1 oz glass of hard liquor (44 mL). Reading food labels  Check nutrition facts on food labels for the amount of fat per serving. Choose foods with less than 3 grams of fat per serving. Shopping Choose nonfat and low-fat healthy foods. Look for the words "nonfat," "low-fat," or "fat-free." Avoid buying processed or prepackaged foods. Cooking Cook using low-fat methods,  such as baking, broiling, grilling, or boiling. Cook with small amounts of healthy fats, such as olive oil, grapeseed oil, canola oil, avocado oil, or sunflower oil. What foods are recommended?  All fresh, frozen, or canned fruits and vegetables. Whole grains. Low-fat or nonfat (skim) milk and yogurt. Lean meat, skinless poultry, fish, eggs, and beans. Low-fat protein supplement powders or drinks. Spices and herbs. The items listed above may not be a complete list of foods and beverages you can eat and drink. Contact a dietitian for more information. What foods are not recommended? High-fat foods. These include baked goods, fast food, fatty cuts of meat, ice cream, french toast, sweet rolls, pizza, cheese bread, foods covered with butter, creamy sauces, or cheese. Fried foods. These include french fries, tempura, battered fish, breaded chicken, fried breads, and sweets. Foods that cause bloating and gas. The items listed above may not be a complete list of foods that you should avoid. Contact a dietitian for more information. Summary A low-fat diet can be helpful if you have a gallbladder condition, or before and after gallbladder surgery. Limit your fat intake to less than 30% of your total daily calories. This is about 60 g of fat if you eat 1,800 calories each day. Eat small, frequent meals throughout the day instead of three large meals. This information is not intended to replace advice given to you by your health care provider. Make sure you discuss any questions you have with your health care provider. Document Revised: 11/16/2021  Document Reviewed: 11/16/2021 Elsevier Patient Education  [place] surgery scheduler will call you within 24-48 hours to schedule your surgery. Please have the [place] surgery sheet available when speaking with her.    Cholelithiasis  Cholelithiasis is a disease in which gallstones form in the gallbladder. The gallbladder is an organ that  stores bile. Bile is a fluid that helps to digest fats. Gallstones begin as small crystals and can slowly grow into stones. They may cause no symptoms until they block the gallbladder duct, or cystic duct, when the gallbladder tightens (contracts) after food is eaten. This can cause pain and is known as a gallbladder attack, or biliary colic. There are two main types of gallstones: Cholesterol stones. These are the most common type of gallstone. These stones are made of hardened cholesterol and are usually yellow-green in color. Cholesterol is a fat-like substance that is made in the liver. Pigment stones. These are dark in color and are made of a red-yellow substance, called bilirubin,that forms when hemoglobin from red blood cells breaks down. What are the causes? This condition may be caused by an imbalance in the different parts that make bile. This can happen if the bile: Has too much bilirubin. This can happen in certain blood diseases, such as sickle cell anemia. Has too much cholesterol. Does not have enough bile salts. These salts help the body absorb and digest fats. In some cases, this condition can also be caused by the gallbladder not emptying completely or often enough. This is common during pregnancy. What increases the risk? The following factors may make you more likely to develop this condition: Being male. Having multiple pregnancies. Health care providers sometimes advise removing diseased gallbladders before future pregnancies. Eating a diet that is heavy in fried foods, fat, and refined carbohydrates, such as white bread and white rice. Being obese. Being older than age 105. Using medicines that contain male hormones (estrogen) for a long time. Losing weight quickly. Having a family history of gallstones. Having certain medical problems, such as: Diabetes mellitus. Cystic fibrosis. Crohn's disease. Cirrhosis or other long-term (chronic) liver disease. Certain blood  diseases, such as sickle cell anemia or leukemia. What are the signs or symptoms? In many cases, having gallstones causes no symptoms. When you have gallstones but do not have symptoms, you have silent gallstones. If a gallstone blocks your bile duct, it can cause a gallbladder attack. The main symptom of a gallbladder attack is sudden pain in the upper right part of the abdomen. The pain: Usually comes at night or after eating. Can last for one hour or more. Can spread to your right shoulder, back, or chest. Can feel like indigestion. This is discomfort, burning, or fullness in your upper abdomen. If the bile duct is blocked for more than a few hours, it can cause an infection or inflammation of your gallbladder (cholecystitis), liver, or pancreas. This can cause: Nausea or vomiting. Bloating. Pain in your abdomen that lasts for 5 hours or longer. Tenderness in your upper abdomen, often in the upper right section and under your rib cage. Fever or chills. Skin or the white parts of your eyes turning yellow (jaundice). This usually happens when a stone has blocked bile from passing through the common bile duct. Dark urine or light-colored stools. How is this diagnosed? This condition may be diagnosed based on: A physical exam. Your medical history. Ultrasound. CT scan. MRI. You may also have other tests, including: Blood tests to check for signs  of an infection or inflammation. Cholescintigraphy, or HIDA scan. This is a scan of your gallbladder and bile ducts (biliary system) using non-harmful radioactive material and special cameras that can see the radioactive material. Endoscopic retrograde cholangiopancreatogram. This involves inserting a small tube with a camera on the end (endoscope) through your mouth to look at bile ducts and check for blockages. How is this treated? Treatment for this condition depends on the severity of the condition. Silent gallstones do not need treatment.  Treatment may be needed if a blockage causes a gallbladder attack or other symptoms. Treatment may include: Home care, if symptoms are not severe. During a simple gallbladder attack, stop eating and drinking for 12-24 hours (except for water and clear liquids). This helps to "cool down" your gallbladder. After 1 or 2 days, you can start to eat a diet of simple or clear foods, such as broths and crackers. You may also need medicines for pain or nausea or both. If you have cholecystitis and an infection, you will need antibiotics. A hospital stay, if needed for pain control or for cholecystitis with severe infection. Cholecystectomy, or surgery to remove your gallbladder. This is the most common treatment if all other treatments have not worked. Medicines to break up gallstones. These are most effective at treating small gallstones. Medicines may be used for up to 6-12 months. Endoscopic retrograde cholangiopancreatogram. A small basket can be attached to the endoscope and used to capture and remove gallstones, mainly those that are in the common bile duct. Follow these instructions at home: Medicines Take over-the-counter and prescription medicines only as told by your health care provider. If you were prescribed an antibiotic medicine, take it as told by your health care provider. Do not stop taking the antibiotic even if you start to feel better. Ask your health care provider if the medicine prescribed to you requires you to avoid driving or using machinery. Eating and drinking Drink enough fluid to keep your urine pale yellow. This is important during a gallbladder attack. Water and clear liquids are preferred. Follow a healthy diet. This includes: Reducing fatty foods, such as fried food and foods high in cholesterol. Reducing refined carbohydrates, such as white bread and white rice. Eating more fiber. Aim for foods such as almonds, fruit, and beans. Alcohol use If you drink alcohol: Limit  how much you use to: 0-1 drink a day for nonpregnant women. 0-2 drinks a day for men. Be aware of how much alcohol is in your drink. In the [place] one drink equals one 12 oz bottle of beer (355 mL), one 5 oz glass of wine (148 mL), or one 1 oz glass of hard liquor (44 mL). General instructions Do not use any products that contain nicotine or tobacco, such as cigarettes, e-cigarettes, and chewing tobacco. If you need help quitting, ask your health care provider. Maintain a healthy weight. Keep all follow-up visits as told by your health care provider. These may include consultations with a surgeon or specialist. This is important. Where to find more information [employer] of Diabetes and Digestive and Kidney Diseases: [employer] Contact a health care provider if: You think you have had a gallbladder attack. You have been diagnosed with silent gallstones and you develop pain in your abdomen or indigestion. You begin to have attacks more often. You have dark urine or light-colored stools. Get help right away if: You have pain from a gallbladder attack that lasts for more than 2 hours. You have pain in your  abdomen that lasts for more than 5 hours or is getting worse. You have a fever or chills. You have nausea and vomiting that do not go away. You develop jaundice. Summary Cholelithiasis is a disease in which gallstones form in the gallbladder. This condition may be caused by an imbalance in the different parts that make bile. This can happen if your bile has too much bilirubin or cholesterol, or does not have enough bile salts. Treatment for gallstones depends on the severity of the condition. Silent gallstones do not need treatment. If gallstones cause a gallbladder attack or other symptoms, treatment usually involves not eating or drinking anything. Treatment may also include pain medicines and antibiotics, and it sometimes includes a hospital stay. Surgery to remove the  gallbladder is common if all other treatments have not worked. This information is not intended to replace advice given to you by your health care provider. Make sure you discuss any questions you have with your health care provider. Document Revised: 10/25/2019 Document Reviewed: 10/25/2019 Elsevier Patient Education  [place].

## 2022-12-26 NOTE — H&P (View-Only) (Signed)
Patient ID: Cameron Watkins, male   DOB: November 15, 1971, 52 y.o.   MRN: [id number]  Chief Complaint: Right upper quadrant pain  History of Present Illness Cameron Watkins is a 52 y.o. male with right upper quadrant pain since October, associated postprandial belching.  Reports nausea and vomiting.  Denies abdominal pain radiation.  Has had multiple episodes since his last ED visit of January 4.  He did report having some associated diaphoresis December.  Denies any history hepatitis or jaundice.  Never instructed on avoidance of fatty foods.  Avoided eating today for concerns regarding exacerbating pain prior to presentation.  Past Medical History Past Medical History:  Diagnosis Date   Diabetes mellitus without complication ([place])    Hypertension       History reviewed. No pertinent surgical history.  Allergies  Allergen Reactions   Green Dye Swelling, Rash and Other (See Comments)    "Hair dye" makes head swell, eyes swell shut, rash on head   Bee Venom Swelling   Penicillins Hives    Current Outpatient Medications  Medication Sig Dispense Refill   aspirin EC 81 MG tablet Take 81 mg by mouth daily. Swallow whole.     metoprolol succinate (TOPROL-XL) 50 MG 24 hr tablet Take 50 mg by mouth daily.     ondansetron (ZOFRAN-ODT) 4 MG disintegrating tablet Take 1 tablet (4 mg total) by mouth every 8 (eight) hours as needed for nausea or vomiting. 30 tablet 0   oxyCODONE-acetaminophen (PERCOCET/ROXICET) 5-325 MG tablet Take 1 tablet by mouth every 4 (four) hours as needed for severe pain. 30 tablet 0   lisinopril (ZESTRIL) 10 MG tablet Take 1 tablet (10 mg total) by mouth daily. 30 tablet 1   metFORMIN (GLUCOPHAGE) 500 MG tablet Take 1 tablet (500 mg total) by mouth 2 (two) times daily with a meal. 60 tablet 1   No current facility-administered medications for this visit.    Family History History reviewed. No pertinent family history.    Social History Social History   Tobacco Use    Smoking status: Every Day    Packs/day: 0.50    Types: Cigarettes   Smokeless tobacco: Never  Vaping Use   Vaping Use: Never used  Substance Use Topics   Alcohol use: Not Currently    Alcohol/week: 4.0 standard drinks of alcohol    Types: 4 Shots of liquor per week   Drug use: Not Currently    Types: Marijuana       Physical Exam Blood pressure 132/88, pulse 93, temperature 98.5 F (36.9 C), temperature source Oral, height 6' 3.25" (1.911 m), weight 280 lb (127 kg), SpO2 98 %. Last Weight  Most recent update: 12/26/2022 10:02 AM    Weight  127 kg (280 lb)             CONSTITUTIONAL: Well developed, and nourished, appropriately responsive and aware without distress.   EYES: Sclera non-icteric.   EARS, NOSE, MOUTH AND THROAT:  The oropharynx is clear. Oral mucosa is pink and moist.   Hearing is intact to voice.  NECK: Trachea is midline, and there is no jugular venous distension.  LYMPH NODES:  Lymph nodes in the neck are not appreciated. RESPIRATORY:  Lungs are clear, and breath sounds are equal bilaterally. Normal respiratory effort without pathologic use of accessory muscles. CARDIOVASCULAR: Heart is regular in rate and rhythm.  Well perfused.  GI: The abdomen is soft, nontender, and nondistended. There were no palpable masses. I did not appreciate hepatosplenomegaly. There were  normal bowel sounds. MUSCULOSKELETAL:  Symmetrical muscle tone appreciated in all four extremities.    SKIN: Skin turgor is normal. No pathologic skin lesions appreciated.  NEUROLOGIC:  Motor and sensation appear grossly normal.  Cranial nerves are grossly without defect. PSYCH:  Alert and oriented to person, place and time. Affect is appropriate for situation.  Data Reviewed I have personally reviewed what is currently available of the patient's imaging, recent labs and medical records.   Labs:     Latest Ref Rng & Units 12/18/2022    9:49 PM 12/25/2021   11:48 AM 12/24/2021    7:24 PM  CBC   WBC 4.0 - 10.5 K/uL 5.2  3.6  4.7   Hemoglobin 13.0 - 17.0 g/dL 12.5  14.0  13.5   Hematocrit 39.0 - 52.0 % 38.9  42.9  41.7   Platelets 150 - 400 K/uL 268  280  287       Latest Ref Rng & Units 12/18/2022    9:49 PM 12/25/2021   11:48 AM 12/24/2021    7:24 PM  CMP  Glucose 70 - 99 mg/dL 143  151  86   BUN 6 - 20 mg/dL 14  12  14    Creatinine 0.61 - 1.24 mg/dL 0.94  1.08  0.90   Sodium 135 - 145 mmol/L 137  132  135   Potassium 3.5 - 5.1 mmol/L 3.1  3.4  3.2   Chloride 98 - 111 mmol/L 105  100  101   CO2 22 - 32 mmol/L 23  25  28    Calcium 8.9 - 10.3 mg/dL 8.6  9.0  9.5   Total Protein 6.5 - 8.1 g/dL 7.2     Total Bilirubin 0.3 - 1.2 mg/dL 0.8     Alkaline Phos 38 - 126 U/L 78     AST 15 - 41 U/L 29     ALT 0 - 44 U/L 51      Imaging: Radiological images reviewed:  CLINICAL DATA:  Epigastric pain   EXAM: ULTRASOUND ABDOMEN LIMITED RIGHT UPPER QUADRANT   COMPARISON:  None Available.   FINDINGS: Gallbladder:   Gallbladder is contracted with gallstones within. No pericholecystic fluid is noted. Negative sonographic Murphy's sign is noted. Gallbladder wall is thickened although related to the decompressed state.   Common bile duct:   Diameter: 3.3 mm.   Liver:   Increased in echogenicity consistent with fatty infiltration. No focal mass is noted. Portal vein is patent on color Doppler imaging with normal direction of blood flow towards the liver.   Other: None.   IMPRESSION: Fatty liver.   Decompressed gallbladder with scattered gallstones.     Electronically Signed   By: Inez Catalina M.D.   On: 12/19/2022 02:27 Within last 24 hrs: No results found.  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  Optimal plan is for robotic cholecystectomy utilizing ICG  imaging. Risks and benefits have been discussed with the patient which include but are not limited to anesthesia, bleeding, infection, biliary ductal injury, resulting in leak or stenosis, other associated unanticipated injuries affiliated with laparoscopic surgery.   Reviewed that removing the gallbladder will only address the symptoms related to the gallbladder itself.  I believe there is the desire to proceed, accepting the risks  with understanding.  Questions elicited and answered to satisfaction.    No guarantees ever expressed or implied.   Face-to-face time spent with the patient and accompanying care providers(if present) was 45 minutes, with more than 50% of the time spent counseling, educating, and coordinating care of the patient.    These notes generated with voice recognition software. I apologize for typographical errors.  Campbell Lerner M.D., FACS 12/26/2022, 1:40 PM

## 2022-12-26 NOTE — Progress Notes (Signed)
Patient ID: Cameron Watkins, male   DOB: November 15, 1971, 52 y.o.   MRN: [id number]  Chief Complaint: Right upper quadrant pain  History of Present Illness Cameron Watkins is a 52 y.o. male with right upper quadrant pain since October, associated postprandial belching.  Reports nausea and vomiting.  Denies abdominal pain radiation.  Has had multiple episodes since his last ED visit of January 4.  He did report having some associated diaphoresis December.  Denies any history hepatitis or jaundice.  Never instructed on avoidance of fatty foods.  Avoided eating today for concerns regarding exacerbating pain prior to presentation.  Past Medical History Past Medical History:  Diagnosis Date   Diabetes mellitus without complication ([facility])    Hypertension       History reviewed. No pertinent surgical history.  Allergies  Allergen Reactions   Green Dye Swelling, Rash and Other (See Comments)    "Hair dye" makes head swell, eyes swell shut, rash on head   Bee Venom Swelling   Penicillins Hives    Current Outpatient Medications  Medication Sig Dispense Refill   aspirin EC 81 MG tablet Take 81 mg by mouth daily. Swallow whole.     metoprolol succinate (TOPROL-XL) 50 MG 24 hr tablet Take 50 mg by mouth daily.     ondansetron (ZOFRAN-ODT) 4 MG disintegrating tablet Take 1 tablet (4 mg total) by mouth every 8 (eight) hours as needed for nausea or vomiting. 30 tablet 0   oxyCODONE-acetaminophen (PERCOCET/ROXICET) 5-325 MG tablet Take 1 tablet by mouth every 4 (four) hours as needed for severe pain. 30 tablet 0   lisinopril (ZESTRIL) 10 MG tablet Take 1 tablet (10 mg total) by mouth daily. 30 tablet 1   metFORMIN (GLUCOPHAGE) 500 MG tablet Take 1 tablet (500 mg total) by mouth 2 (two) times daily with a meal. 60 tablet 1   No current facility-administered medications for this visit.    Family History History reviewed. No pertinent family history.    Social History Social History   Tobacco Use    Smoking status: Every Day    Packs/day: 0.50    Types: Cigarettes   Smokeless tobacco: Never  Vaping Use   Vaping Use: Never used  Substance Use Topics   Alcohol use: Not Currently    Alcohol/week: 4.0 standard drinks of alcohol    Types: 4 Shots of liquor per week   Drug use: Not Currently    Types: Marijuana       Physical Exam Blood pressure 132/88, pulse 93, temperature 98.5 F (36.9 C), temperature source Oral, height 6' 3.25" (1.911 m), weight 280 lb (127 kg), SpO2 98 %. Last Weight  Most recent update: 12/26/2022 10:02 AM    Weight  127 kg (280 lb)             CONSTITUTIONAL: Well developed, and nourished, appropriately responsive and aware without distress.   EYES: Sclera non-icteric.   EARS, NOSE, MOUTH AND THROAT:  The oropharynx is clear. Oral mucosa is pink and moist.   Hearing is intact to voice.  NECK: Trachea is midline, and there is no jugular venous distension.  LYMPH NODES:  Lymph nodes in the neck are not appreciated. RESPIRATORY:  Lungs are clear, and breath sounds are equal bilaterally. Normal respiratory effort without pathologic use of accessory muscles. CARDIOVASCULAR: Heart is regular in rate and rhythm.  Well perfused.  GI: The abdomen is soft, nontender, and nondistended. There were no palpable masses. I did not appreciate hepatosplenomegaly. There were  normal bowel sounds. MUSCULOSKELETAL:  Symmetrical muscle tone appreciated in all four extremities.    SKIN: Skin turgor is normal. No pathologic skin lesions appreciated.  NEUROLOGIC:  Motor and sensation appear grossly normal.  Cranial nerves are grossly without defect. PSYCH:  Alert and oriented to person, place and time. Affect is appropriate for situation.  Data Reviewed I have personally reviewed what is currently available of the patient's imaging, recent labs and medical records.   Labs:     Latest Ref Rng & Units 12/18/2022    9:49 PM 12/25/2021   11:48 AM 12/24/2021    7:24 PM  CBC   WBC 4.0 - 10.5 K/uL 5.2  3.6  4.7   Hemoglobin 13.0 - 17.0 g/dL 12.5  14.0  13.5   Hematocrit 39.0 - 52.0 % 38.9  42.9  41.7   Platelets 150 - 400 K/uL 268  280  287       Latest Ref Rng & Units 12/18/2022    9:49 PM 12/25/2021   11:48 AM 12/24/2021    7:24 PM  CMP  Glucose 70 - 99 mg/dL 143  151  86   BUN 6 - 20 mg/dL 14  12  14    Creatinine 0.61 - 1.24 mg/dL 0.94  1.08  0.90   Sodium 135 - 145 mmol/L 137  132  135   Potassium 3.5 - 5.1 mmol/L 3.1  3.4  3.2   Chloride 98 - 111 mmol/L 105  100  101   CO2 22 - 32 mmol/L 23  25  28    Calcium 8.9 - 10.3 mg/dL 8.6  9.0  9.5   Total Protein 6.5 - 8.1 g/dL 7.2     Total Bilirubin 0.3 - 1.2 mg/dL 0.8     Alkaline Phos 38 - 126 U/L 78     AST 15 - 41 U/L 29     ALT 0 - 44 U/L 51      Imaging: Radiological images reviewed:  CLINICAL DATA:  Epigastric pain   EXAM: ULTRASOUND ABDOMEN LIMITED RIGHT UPPER QUADRANT   COMPARISON:  None Available.   FINDINGS: Gallbladder:   Gallbladder is contracted with gallstones within. No pericholecystic fluid is noted. Negative sonographic Murphy's sign is noted. Gallbladder wall is thickened although related to the decompressed state.   Common bile duct:   Diameter: 3.3 mm.   Liver:   Increased in echogenicity consistent with fatty infiltration. No focal mass is noted. Portal vein is patent on color Doppler imaging with normal direction of blood flow towards the liver.   Other: None.   IMPRESSION: Fatty liver.   Decompressed gallbladder with scattered gallstones.     Electronically Signed   By: Inez Catalina M.D.   On: 12/19/2022 02:27 Within last 24 hrs: No results found.  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  Optimal plan is for robotic cholecystectomy utilizing ICG  imaging. Risks and benefits have been discussed with the patient which include but are not limited to anesthesia, bleeding, infection, biliary ductal injury, resulting in leak or stenosis, other associated unanticipated injuries affiliated with laparoscopic surgery.   Reviewed that removing the gallbladder will only address the symptoms related to the gallbladder itself.  I believe there is the desire to proceed, accepting the risks  with understanding.  Questions elicited and answered to satisfaction.    No guarantees ever expressed or implied.   Face-to-face time spent with the patient and accompanying care providers(if present) was 45 minutes, with more than 50% of the time spent counseling, educating, and coordinating care of the patient.    These notes generated with voice recognition software. I apologize for typographical errors.  Elihue Ebert M.D., FACS 12/26/2022, 1:40 PM     

## 2022-12-26 NOTE — Telephone Encounter (Signed)
Unable to leave a message, voice mail not available.  Please inform patient of the following scheduled surgery with Dr. Christian Mate.   Pre-Admission date/time, and Surgery date at [facility].  Surgery Date: 01/03/23 Preadmission Testing Date: 12/27/22 (phone 1p-5p)  Also patient will need to call at [phone], between 1-3:00pm the day before surgery, to find out what time to arrive for surgery.

## 2022-12-27 ENCOUNTER — Encounter
Admission: RE | Admit: 2022-12-27 | Discharge: 2022-12-27 | Disposition: A | Discharge status: Home / Self Care | Payer: Self-pay | Source: Ambulatory Visit | Attending: Surgery | Admitting: Surgery

## 2022-12-27 ENCOUNTER — Other Ambulatory Visit: Payer: Self-pay

## 2022-12-27 DIAGNOSIS — E119 Type 2 diabetes mellitus without complications: Secondary | ICD-10-CM

## 2022-12-27 DIAGNOSIS — Z01812 Encounter for preprocedural laboratory examination: Secondary | ICD-10-CM

## 2022-12-27 HISTORY — DX: Cardiac murmur, unspecified: R01.1

## 2022-12-27 HISTORY — DX: Gastro-esophageal reflux disease without esophagitis: K21.9

## 2022-12-27 HISTORY — DX: Family history of other specified conditions: Z84.89

## 2022-12-27 NOTE — Patient Instructions (Addendum)
Your procedure is scheduled on: 01/03/23 - Friday Report to the Registration Desk on the 1st floor of the [facility]. To find out your arrival time, please call [phone] between 1PM - 3PM on: 01/02/23 - Thursday If your arrival time is 6:00 am, do not arrive prior to that time as the [place] entrance doors do not open until 6:00 am.  REMEMBER: Instructions that are not followed completely may result in serious medical risk, up to and including death; or upon the discretion of your surgeon and anesthesiologist your surgery may need to be rescheduled.  Do not eat food or drink any liquids after midnight the night before surgery.  No gum chewing, lozengers or hard candies.  TAKE THESE MEDICATIONS THE MORNING OF SURGERY WITH A SIP OF WATER:  - metoprolol succinate (TOPROL-XL)   HOLD metFORMIN (GLUCOPHAGE) beginning 01/01/23 .  One week prior to surgery: Stop Anti-inflammatories (NSAIDS) such as Advil, Aleve, Ibuprofen, Motrin, Naproxen, Naprosyn and Aspirin based products such as Excedrin, Goodys Powder, BC Powder.  Stop ANY OVER THE COUNTER supplements until after surgery.  You may take Tylenol if needed for pain up until the day of surgery.  No Alcohol for 24 hours before or after surgery.  No Smoking including e-cigarettes for 24 hours prior to surgery.  No chewable tobacco products for at least 6 hours prior to surgery.  No nicotine patches on the day of surgery.  Do not use any "recreational" drugs for at least a week prior to your surgery.  Please be advised that the combination of cocaine and anesthesia may have negative outcomes, up to and including death. If you test positive for cocaine, your surgery will be cancelled.  On the morning of surgery brush your teeth with toothpaste and water, you may rinse your mouth with mouthwash if you wish. Do not swallow any toothpaste or mouthwash.  Use CHG Soap or wipes as directed on instruction sheet.  Do not wear  jewelry, make-up, hairpins, clips or nail polish.  Do not wear lotions, powders, or perfumes.   Do not shave body from the neck down 48 hours prior to surgery just in case you cut yourself which could leave a site for infection.  Also, freshly shaved skin may become irritated if using the CHG soap.  Contact lenses, hearing aids and dentures may not be worn into surgery.  Do not bring valuables to the hospital. [facility] is not responsible for any missing/lost belongings or valuables.   Notify your doctor if there is any change in your medical condition (cold, fever, infection).  Wear comfortable clothing (specific to your surgery type) to the hospital.  After surgery, you can help prevent lung complications by doing breathing exercises.  Take deep breaths and cough every 1-2 hours. Your doctor may order a device called an Incentive Spirometer to help you take deep breaths. When coughing or sneezing, hold a pillow firmly against your incision with both hands. This is called "splinting." Doing this helps protect your incision. It also decreases belly discomfort.  If you are being admitted to the hospital overnight, leave your suitcase in the car. After surgery it may be brought to your room.  If you are being discharged the day of surgery, you will not be allowed to drive home. You will need a responsible adult (18 years or older) to drive you home and stay with you that night.   If you are taking public transportation, you will need to have a responsible  adult (18 years or older) with you. Please confirm with your physician that it is acceptable to use public transportation.   Please call the [place] Dept. at [phone] if you have any questions about these instructions.  Surgery Visitation Policy:  Patients undergoing a surgery or procedure may have two family members or support persons with them as long as the person is not COVID-19 positive or experiencing its  symptoms.   Inpatient Visitation:    Visiting hours are 7 a.m. to 8 p.m. Up to four visitors are allowed at one time in a patient room. The visitors may rotate out with other people during the day. One designated support person (adult) may remain overnight.  Due to an increase in RSV and influenza rates and associated hospitalizations, children ages 33 and under will not be able to visit patients in [facility]. Masks continue to be strongly recommended.

## 2022-12-27 NOTE — Addendum Note (Signed)
Addended by: Cletis Media on: 12/27/2022 03:29 PM   Modules accepted: Orders

## 2022-12-27 NOTE — Telephone Encounter (Signed)
Patient called back and surgery information reviewed.  

## 2022-12-30 ENCOUNTER — Encounter
Admission: RE | Admit: 2022-12-30 | Discharge: 2022-12-30 | Disposition: A | Discharge status: Home / Self Care | Payer: Self-pay | Source: Ambulatory Visit | Attending: Surgery | Admitting: Surgery

## 2022-12-30 DIAGNOSIS — K8010 Calculus of gallbladder with chronic cholecystitis without obstruction: Secondary | ICD-10-CM | POA: Insufficient documentation

## 2022-12-30 DIAGNOSIS — Z01812 Encounter for preprocedural laboratory examination: Secondary | ICD-10-CM | POA: Insufficient documentation

## 2022-12-30 LAB — BASIC METABOLIC PANEL
Anion gap: 8 (ref 5–15)
BUN: 12 mg/dL (ref 6–20)
CO2: 24 mmol/L (ref 22–32)
Calcium: 8.7 mg/dL — ABNORMAL LOW (ref 8.9–10.3)
Chloride: 106 mmol/L (ref 98–111)
Creatinine, Ser: 0.89 mg/dL (ref 0.61–1.24)
GFR, Estimated: >60 mL/min (ref >60)
Glucose, Bld: 116 mg/dL — ABNORMAL HIGH (ref 70–99)
Potassium: 3.4 mmol/L — ABNORMAL LOW (ref 3.5–5.1)
Sodium: 138 mmol/L (ref 135–145)

## 2023-01-02 MED ORDER — SODIUM CHLORIDE 0.9 % IV SOLN: INTRAVENOUS | Status: DC

## 2023-01-02 MED ORDER — ORAL CARE MOUTH RINSE: 15.0000 mL | Freq: Once | OROMUCOSAL | Status: DC

## 2023-01-02 MED ORDER — CELECOXIB 200 MG PO CAPS: 200.0000 mg | ORAL_CAPSULE | ORAL | Status: DC

## 2023-01-02 MED ORDER — CIPROFLOXACIN IN D5W 400 MG/200ML IV SOLN: 400.0000 mg | INTRAVENOUS | Status: DC

## 2023-01-02 MED ORDER — BUPIVACAINE LIPOSOME 1.3 % IJ SUSP: 20.0000 mL | Freq: Once | INTRAMUSCULAR | Status: DC

## 2023-01-02 MED ORDER — INDOCYANINE GREEN 25 MG IV SOLR
1.2500 mg | Freq: Once | INTRAVENOUS | Status: DC
  Filled 2023-01-02: qty 10

## 2023-01-02 MED ORDER — GABAPENTIN 300 MG PO CAPS: 300.0000 mg | ORAL_CAPSULE | ORAL | Status: DC

## 2023-01-02 MED ORDER — FAMOTIDINE 20 MG PO TABS: 20.0000 mg | ORAL_TABLET | Freq: Once | ORAL | Status: DC

## 2023-01-02 MED ORDER — CHLORHEXIDINE GLUCONATE CLOTH 2 % EX PADS: 6.0000 | MEDICATED_PAD | Freq: Once | CUTANEOUS | Status: DC

## 2023-01-02 MED ORDER — CHLORHEXIDINE GLUCONATE 0.12 % MT SOLN: 15.0000 mL | Freq: Once | OROMUCOSAL | Status: DC

## 2023-01-02 MED ORDER — ACETAMINOPHEN 500 MG PO TABS: 1000.0000 mg | ORAL_TABLET | ORAL | Status: DC

## 2023-01-03 ENCOUNTER — Encounter: Admission: RE | Payer: Self-pay | Source: Home / Self Care

## 2023-01-03 ENCOUNTER — Ambulatory Visit: Admission: RE | Admit: 2023-01-03 | Payer: Self-pay | Source: Home / Self Care | Admitting: Surgery

## 2023-01-03 SURGERY — CHOLECYSTECTOMY, ROBOT-ASSISTED, LAPAROSCOPIC
Anesthesia: General

## 2023-01-06 ENCOUNTER — Telehealth: Payer: Self-pay | Admitting: Surgery

## 2023-01-06 NOTE — Telephone Encounter (Signed)
Updated information regarding rescheduled surgery at providers request.    Patient has been advised of Pre-Admission date/time, and Surgery date at [facility].  Surgery Date: 01/22/23 Preadmission Testing Date: 01/13/23 (phone 8a-1p)  Patient has been made aware to call [phone], between 1-3:00pm the day before surgery, to find out what time to arrive for surgery.

## 2023-01-12 ENCOUNTER — Encounter: Payer: Self-pay | Admitting: Urgent Care

## 2023-01-13 ENCOUNTER — Other Ambulatory Visit: Payer: Self-pay

## 2023-01-13 ENCOUNTER — Encounter
Admission: RE | Admit: 2023-01-13 | Discharge: 2023-01-13 | Disposition: A | Discharge status: Home / Self Care | Payer: Self-pay | Source: Ambulatory Visit | Attending: Surgery | Admitting: Surgery

## 2023-01-13 HISTORY — DX: Endocarditis, valve unspecified: I38

## 2023-01-13 HISTORY — DX: Sleep apnea, unspecified: G47.30

## 2023-01-13 HISTORY — DX: Syphilis, unspecified: A53.9

## 2023-01-13 NOTE — Patient Instructions (Addendum)
Your procedure is scheduled on: 01/22/2023  Report to the Registration Desk on the 1st floor of the [facility]. To find out your arrival time, please call [phone] between [place] on:  2/6/ 2024  If your arrival time is 6:00 am, do not arrive prior to that time as the [place] entrance doors do not open until 6:00 am.  REMEMBER: Instructions that are not followed completely may result in serious medical risk, up to and including death; or upon the discretion of your surgeon and anesthesiologist your surgery may need to be rescheduled.  Do not eat food after midnight the night before surgery.  No gum chewing, lozengers or hard candies.   TAKE THESE MEDICATIONS THE MORNING OF SURGERY WITH A SIP OF WATER: metoprolol succinate (TOPROL-XL)   Follow recommendations from Cardiologist or PCP regarding stopping Aspirin, Coumadin, Plavix, Eliquis, Pradaxa, or Pletal. Follow your surgeon's instructions regarding aspirin .  Hold metFORMIN for two days prior to surgery. Last dose should be Jan 19, 2023.  One week prior to surgery: Stop Anti-inflammatories (NSAIDS) such as Advil, Aleve, Ibuprofen, Motrin, Naproxen, Naprosyn and Aspirin based products such as Excedrin, Goodys Powder, BC Powder. Stop ANY OVER THE COUNTER supplements until after surgery. You may however, continue to take Tylenol if needed for pain up until the day of surgery.  No Alcohol for 24 hours before or after surgery.  No Smoking including e-cigarettes for 24 hours prior to surgery.  No chewable tobacco products for at least 6 hours prior to surgery.  No nicotine patches on the day of surgery.  Do not use any "recreational" drugs for at least a week prior to your surgery.  Please be advised that the combination of cocaine and anesthesia may have negative outcomes, up to and including death. If you test positive for cocaine, your surgery will be cancelled.  On the morning of surgery brush your teeth with toothpaste  and water, you may rinse your mouth with mouthwash if you wish. Do not swallow any toothpaste or mouthwash.  Use CHG Soap as directed on instruction sheet.  Do not wear jewelry, make-up, hairpins, clips or nail polish.  Do not wear lotions, powders, or perfumes.   Do not shave body from the neck down 48 hours prior to surgery just in case you cut yourself which could leave a site for infection.  Also, freshly shaved skin may become irritated if using the CHG soap.  Contact lenses, hearing aids and dentures may not be worn into surgery.  Do not bring valuables to the hospital. [facility] is not responsible for any missing/lost belongings or valuables.    Notify your doctor if there is any change in your medical condition (cold, fever, infection).  Wear comfortable clothing (specific to your surgery type) to the hospital.  After surgery, you can help prevent lung complications by doing breathing exercises.  Take deep breaths and cough every 1-2 hours. Your doctor may order a device called an Incentive Spirometer to help you take deep breaths.   If you are being admitted to the hospital overnight, leave your suitcase in the car. After surgery it may be brought to your room.  If you are being discharged the day of surgery, you will not be allowed to drive home. You will need a responsible adult (18 years or older) to drive you home and stay with you that night.   If you are taking public transportation, you will need to have a responsible adult (  18 years or older) with you. Please confirm with your physician that it is acceptable to use public transportation.   Please call the [place] Dept. at [phone] if you have any questions about these instructions.  Surgery Visitation Policy:  Patients undergoing a surgery or procedure may have two family members or support persons with them as long as the person is not COVID-19 positive or experiencing its symptoms.    Inpatient Visitation:    Visiting hours are 7 a.m. to 8 p.m. Up to four visitors are allowed at one time in a patient room. The visitors may rotate out with other people during the day. One designated support person (adult) may remain overnight.  Due to an increase in RSV and influenza rates and associated hospitalizations, children ages 71 and under will not be able to visit patients in [facility]. Masks continue to be strongly recommended.

## 2023-01-22 ENCOUNTER — Encounter: Payer: Self-pay | Admitting: Surgery

## 2023-01-22 ENCOUNTER — Ambulatory Visit
Admission: RE | Admit: 2023-01-22 | Discharge: 2023-01-22 | Disposition: A | Discharge status: Home / Self Care | Payer: Self-pay | Attending: Surgery | Admitting: Surgery

## 2023-01-22 ENCOUNTER — Other Ambulatory Visit: Payer: Self-pay

## 2023-01-22 ENCOUNTER — Encounter
Admission: RE | Disposition: A | Discharge status: Home / Self Care | Payer: Self-pay | Source: Home / Self Care | Attending: Surgery

## 2023-01-22 DIAGNOSIS — Z538 Procedure and treatment not carried out for other reasons: Secondary | ICD-10-CM | POA: Insufficient documentation

## 2023-01-22 DIAGNOSIS — K8010 Calculus of gallbladder with chronic cholecystitis without obstruction: Secondary | ICD-10-CM | POA: Insufficient documentation

## 2023-01-22 DIAGNOSIS — F17210 Nicotine dependence, cigarettes, uncomplicated: Secondary | ICD-10-CM | POA: Insufficient documentation

## 2023-01-22 HISTORY — DX: Other psychoactive substance abuse, uncomplicated: F19.10

## 2023-01-22 LAB — URINE DRUG SCREEN, QUALITATIVE (ARMC ONLY)
Amphetamines, Ur Screen: NOT DETECTED
Barbiturates, Ur Screen: NOT DETECTED
Benzodiazepine, Ur Scrn: NOT DETECTED
Cannabinoid 50 Ng, Ur ~~LOC~~: POSITIVE — AB
Cocaine Metabolite,Ur ~~LOC~~: POSITIVE — AB
MDMA (Ecstasy)Ur Screen: NOT DETECTED
Methadone Scn, Ur: NOT DETECTED
Opiate, Ur Screen: NOT DETECTED
Phencyclidine (PCP) Ur S: NOT DETECTED
Tricyclic, Ur Screen: NOT DETECTED

## 2023-01-22 LAB — GLUCOSE, CAPILLARY: Glucose-Capillary: 102 mg/dL — ABNORMAL HIGH (ref 70–99)

## 2023-01-22 SURGERY — CHOLECYSTECTOMY, ROBOT-ASSISTED, LAPAROSCOPIC
Anesthesia: General

## 2023-01-22 MED ORDER — BUPIVACAINE HCL (PF) 0.25 % IJ SOLN
INTRAMUSCULAR | Status: AC
  Filled 2023-01-22: qty 30

## 2023-01-22 MED ORDER — INDOCYANINE GREEN 25 MG IV SOLR: 1.2500 mg | Freq: Once | INTRAVENOUS | Status: DC

## 2023-01-22 MED ORDER — CHLORHEXIDINE GLUCONATE CLOTH 2 % EX PADS: 6.0000 | MEDICATED_PAD | Freq: Once | CUTANEOUS | Status: DC

## 2023-01-22 MED ORDER — CIPROFLOXACIN IN D5W 400 MG/200ML IV SOLN: 400.0000 mg | INTRAVENOUS | Status: DC

## 2023-01-22 MED ORDER — MIDAZOLAM HCL 2 MG/2ML IJ SOLN
INTRAMUSCULAR | Status: AC
  Filled 2023-01-22: qty 2

## 2023-01-22 MED ORDER — PROPOFOL 10 MG/ML IV BOLUS
INTRAVENOUS | Status: AC
  Filled 2023-01-22: qty 20

## 2023-01-22 MED ORDER — CHLORHEXIDINE GLUCONATE 0.12 % MT SOLN: 15.0000 mL | Freq: Once | OROMUCOSAL | Status: DC

## 2023-01-22 MED ORDER — FAMOTIDINE 20 MG PO TABS: 20.0000 mg | ORAL_TABLET | Freq: Once | ORAL | Status: DC

## 2023-01-22 MED ORDER — INDOCYANINE GREEN 25 MG IV SOLR
1.2500 mg | Freq: Once | INTRAVENOUS | Status: DC
  Filled 2023-01-22: qty 10

## 2023-01-22 MED ORDER — SODIUM CHLORIDE 0.9 % IV SOLN: INTRAVENOUS | Status: DC

## 2023-01-22 MED ORDER — BUPIVACAINE LIPOSOME 1.3 % IJ SUSP: 20.0000 mL | Freq: Once | INTRAMUSCULAR | Status: DC

## 2023-01-22 MED ORDER — FENTANYL CITRATE (PF) 100 MCG/2ML IJ SOLN
INTRAMUSCULAR | Status: AC
  Filled 2023-01-22: qty 2

## 2023-01-22 MED ORDER — CIPROFLOXACIN IN D5W 400 MG/200ML IV SOLN: 400.0000 mg | Freq: Once | INTRAVENOUS | Status: DC

## 2023-01-22 MED ORDER — ACETAMINOPHEN 500 MG PO TABS: 1000.0000 mg | ORAL_TABLET | ORAL | Status: DC

## 2023-01-22 MED ORDER — ROCURONIUM BROMIDE 10 MG/ML (PF) SYRINGE
PREFILLED_SYRINGE | INTRAVENOUS | Status: AC
  Filled 2023-01-22: qty 10

## 2023-01-22 MED ORDER — CELECOXIB 200 MG PO CAPS: 200.0000 mg | ORAL_CAPSULE | ORAL | Status: DC

## 2023-01-22 MED ORDER — ORAL CARE MOUTH RINSE: 15.0000 mL | Freq: Once | OROMUCOSAL | Status: DC

## 2023-01-22 MED ORDER — GABAPENTIN 300 MG PO CAPS: 300.0000 mg | ORAL_CAPSULE | ORAL | Status: DC

## 2023-01-22 MED ORDER — ONDANSETRON HCL 4 MG/2ML IJ SOLN
INTRAMUSCULAR | Status: AC
  Filled 2023-01-22: qty 2

## 2023-01-22 MED ORDER — LIDOCAINE HCL (PF) 2 % IJ SOLN
INTRAMUSCULAR | Status: AC
  Filled 2023-01-22: qty 5

## 2023-01-22 MED ORDER — GLYCOPYRROLATE 0.2 MG/ML IJ SOLN
INTRAMUSCULAR | Status: AC
  Filled 2023-01-22: qty 1

## 2023-01-22 MED ORDER — EPINEPHRINE PF 1 MG/ML IJ SOLN
INTRAMUSCULAR | Status: AC
  Filled 2023-01-22: qty 1

## 2023-01-22 MED ORDER — DEXAMETHASONE SODIUM PHOSPHATE 10 MG/ML IJ SOLN
INTRAMUSCULAR | Status: AC
  Filled 2023-01-22: qty 1

## 2023-01-22 SURGICAL SUPPLY — 44 items
BAG PRESSURE INF REUSE 3000 (BAG) IMPLANT
CLIP LIGATING HEM O LOK PURPLE (MISCELLANEOUS) ×1 IMPLANT
COVER TIP SHEARS 8 DVNC (MISCELLANEOUS) ×1 IMPLANT
COVER TIP SHEARS 8MM DA VINCI (MISCELLANEOUS) ×1
DERMABOND ADVANCED .7 DNX12 (GAUZE/BANDAGES/DRESSINGS) ×1 IMPLANT
DRAPE ARM DVNC X/XI (DISPOSABLE) ×4 IMPLANT
DRAPE COLUMN DVNC XI (DISPOSABLE) ×1 IMPLANT
DRAPE DA VINCI XI ARM (DISPOSABLE) ×4
DRAPE DA VINCI XI COLUMN (DISPOSABLE) ×1
ELECT CAUTERY BLADE 6.4 (BLADE) ×1 IMPLANT
GLOVE ORTHO TXT STRL SZ7.5 (GLOVE) ×2 IMPLANT
GOWN STRL REUS W/ TWL LRG LVL3 (GOWN DISPOSABLE) ×2 IMPLANT
GOWN STRL REUS W/ TWL XL LVL3 (GOWN DISPOSABLE) ×2 IMPLANT
GOWN STRL REUS W/TWL LRG LVL3 (GOWN DISPOSABLE) ×2
GOWN STRL REUS W/TWL XL LVL3 (GOWN DISPOSABLE) ×2
GRASPER SUT TROCAR 14GX15 (MISCELLANEOUS) ×1 IMPLANT
IRRIGATION STRYKERFLOW (MISCELLANEOUS) IMPLANT
IRRIGATOR STRYKERFLOW (MISCELLANEOUS)
IRRIGATOR SUCT 8 DISP DVNC XI (IRRIGATION / IRRIGATOR) IMPLANT
IRRIGATOR SUCTION 8MM XI DISP (IRRIGATION / IRRIGATOR)
IV NS IRRIG 3000ML ARTHROMATIC (IV SOLUTION) IMPLANT
KIT PINK PAD W/HEAD ARE REST (MISCELLANEOUS) ×1
KIT PINK PAD W/HEAD ARM REST (MISCELLANEOUS) ×1 IMPLANT
KIT TURNOVER KIT A (KITS) ×1 IMPLANT
LABEL OR SOLS (LABEL) ×1 IMPLANT
MANIFOLD NEPTUNE II (INSTRUMENTS) ×1 IMPLANT
NEEDLE HYPO 22GX1.5 SAFETY (NEEDLE) ×1 IMPLANT
NEEDLE INSUFFLATION 14GA 120MM (NEEDLE) ×1 IMPLANT
NS IRRIG 500ML POUR BTL (IV SOLUTION) ×1 IMPLANT
PACK LAP CHOLECYSTECTOMY (MISCELLANEOUS) ×1 IMPLANT
SEAL CANN UNIV 5-8 DVNC XI (MISCELLANEOUS) ×4 IMPLANT
SEAL XI 5MM-8MM UNIVERSAL (MISCELLANEOUS) ×4
SET TUBE SMOKE EVAC HIGH FLOW (TUBING) ×1 IMPLANT
SOLUTION ELECTROLUBE (MISCELLANEOUS) ×1 IMPLANT
SPIKE FLUID TRANSFER (MISCELLANEOUS) ×1 IMPLANT
SUT MNCRL 4-0 (SUTURE) ×1
SUT MNCRL 4-0 27XMFL (SUTURE) ×1
SUT VICRYL 0 UR6 27IN ABS (SUTURE) ×1 IMPLANT
SUTURE MNCRL 4-0 27XMF (SUTURE) ×1 IMPLANT
SYS BAG RETRIEVAL 10MM (BASKET) ×1
SYSTEM BAG RETRIEVAL 10MM (BASKET) ×1 IMPLANT
TRAP FLUID SMOKE EVACUATOR (MISCELLANEOUS) ×1 IMPLANT
TROCAR Z-THREAD FIOS 11X100 BL (TROCAR) ×1 IMPLANT
WATER STERILE IRR 500ML POUR (IV SOLUTION) ×1 IMPLANT

## 2023-01-22 NOTE — Interval H&P Note (Signed)
History and Physical Interval Note:  01/22/2023 1:35 PM  Cameron Watkins  has presented today for surgery, with the diagnosis of chronic calculous cholecystitis.  The various methods of treatment have been discussed with the patient and family. After consideration of risks, benefits and other options for treatment, the patient has consented to  Procedure(s): XI ROBOTIC ASSISTED LAPAROSCOPIC CHOLECYSTECTOMY (N/A) [name] (ICG) (N/A) as a surgical intervention.  The patient's history has been reviewed, patient examined, no change in status, stable for surgery.  I have reviewed the patient's chart and labs.  Questions were answered to the patient's satisfaction.     Ronny Bacon

## 2023-01-24 ENCOUNTER — Telehealth: Payer: Self-pay | Admitting: Surgery

## 2023-01-24 ENCOUNTER — Inpatient Hospital Stay: Admission: RE | Admit: 2023-01-24 | Payer: Self-pay | Source: Ambulatory Visit

## 2023-01-24 NOTE — Telephone Encounter (Signed)
Left message for patient to call, please inform him of the following regarding rescheduled surgery due to cocaine positive.  Also remind patient that he will be drug tested again.    Pre-Admission date/time, and Surgery date at [facility].  Surgery Date: 01/31/23 Preadmission Testing Date: 01/30/23 (phone 8a-1p)  Also patient will need to call at [phone], between 1-3:00pm the day before surgery, to find out what time to arrive for surgery.

## 2023-01-27 NOTE — Telephone Encounter (Signed)
Spoke with wife, Ivin Booty and they are now aware of all dates regarding his rescheduled surgery.

## 2023-01-29 ENCOUNTER — Encounter: Payer: Self-pay | Admitting: Urgent Care

## 2023-01-29 ENCOUNTER — Inpatient Hospital Stay: Admission: RE | Admit: 2023-01-29 | Payer: Self-pay | Source: Ambulatory Visit

## 2023-01-30 ENCOUNTER — Inpatient Hospital Stay: Admission: RE | Admit: 2023-01-30 | Payer: Self-pay | Source: Ambulatory Visit

## 2023-01-30 ENCOUNTER — Encounter
Admission: RE | Admit: 2023-01-30 | Discharge: 2023-01-30 | Disposition: A | Discharge status: Home / Self Care | Payer: Self-pay | Source: Ambulatory Visit | Attending: Surgery | Admitting: Surgery

## 2023-01-30 ENCOUNTER — Other Ambulatory Visit: Payer: Self-pay | Admitting: Cardiovascular Disease

## 2023-01-30 ENCOUNTER — Encounter: Payer: Self-pay | Admitting: Urgent Care

## 2023-01-30 DIAGNOSIS — Z01812 Encounter for preprocedural laboratory examination: Secondary | ICD-10-CM | POA: Insufficient documentation

## 2023-01-30 DIAGNOSIS — R079 Chest pain, unspecified: Secondary | ICD-10-CM

## 2023-01-30 DIAGNOSIS — R825 Elevated urine levels of drugs, medicaments and biological substances: Secondary | ICD-10-CM | POA: Insufficient documentation

## 2023-01-30 DIAGNOSIS — F1290 Cannabis use, unspecified, uncomplicated: Secondary | ICD-10-CM | POA: Insufficient documentation

## 2023-01-30 DIAGNOSIS — F1490 Cocaine use, unspecified, uncomplicated: Secondary | ICD-10-CM | POA: Insufficient documentation

## 2023-01-30 LAB — URINE DRUG SCREEN, QUALITATIVE (ARMC ONLY)
Amphetamines, Ur Screen: NOT DETECTED
Barbiturates, Ur Screen: NOT DETECTED
Benzodiazepine, Ur Scrn: NOT DETECTED
Cannabinoid 50 Ng, Ur ~~LOC~~: POSITIVE — AB
Cocaine Metabolite,Ur ~~LOC~~: NOT DETECTED
MDMA (Ecstasy)Ur Screen: NOT DETECTED
Methadone Scn, Ur: NOT DETECTED
Opiate, Ur Screen: NOT DETECTED
Phencyclidine (PCP) Ur S: NOT DETECTED
Tricyclic, Ur Screen: NOT DETECTED

## 2023-01-31 ENCOUNTER — Other Ambulatory Visit: Payer: Self-pay | Admitting: Surgery

## 2023-01-31 ENCOUNTER — Encounter
Admission: RE | Disposition: A | Discharge status: Home / Self Care | Payer: Self-pay | Source: Home / Self Care | Attending: Surgery

## 2023-01-31 ENCOUNTER — Encounter: Payer: Self-pay | Admitting: Surgery

## 2023-01-31 ENCOUNTER — Ambulatory Visit
Admission: RE | Admit: 2023-01-31 | Discharge: 2023-01-31 | Disposition: A | Discharge status: Home / Self Care | Payer: Self-pay | Attending: Surgery | Admitting: Surgery

## 2023-01-31 ENCOUNTER — Ambulatory Visit: Payer: Self-pay | Admitting: Surgery

## 2023-01-31 ENCOUNTER — Ambulatory Visit: Payer: Self-pay

## 2023-01-31 DIAGNOSIS — Z9049 Acquired absence of other specified parts of digestive tract: Secondary | ICD-10-CM | POA: Insufficient documentation

## 2023-01-31 DIAGNOSIS — K8010 Calculus of gallbladder with chronic cholecystitis without obstruction: Secondary | ICD-10-CM

## 2023-01-31 DIAGNOSIS — Z01812 Encounter for preprocedural laboratory examination: Secondary | ICD-10-CM

## 2023-01-31 DIAGNOSIS — R1013 Epigastric pain: Secondary | ICD-10-CM | POA: Insufficient documentation

## 2023-01-31 DIAGNOSIS — F1490 Cocaine use, unspecified, uncomplicated: Secondary | ICD-10-CM

## 2023-01-31 DIAGNOSIS — K2100 Gastro-esophageal reflux disease with esophagitis, without bleeding: Secondary | ICD-10-CM

## 2023-01-31 DIAGNOSIS — Z538 Procedure and treatment not carried out for other reasons: Secondary | ICD-10-CM | POA: Insufficient documentation

## 2023-01-31 DIAGNOSIS — R825 Elevated urine levels of drugs, medicaments and biological substances: Secondary | ICD-10-CM

## 2023-01-31 DIAGNOSIS — F1290 Cannabis use, unspecified, uncomplicated: Secondary | ICD-10-CM

## 2023-01-31 DIAGNOSIS — K760 Fatty (change of) liver, not elsewhere classified: Secondary | ICD-10-CM | POA: Insufficient documentation

## 2023-01-31 LAB — URINE DRUG SCREEN, QUALITATIVE (ARMC ONLY)
Amphetamines, Ur Screen: NOT DETECTED
Barbiturates, Ur Screen: NOT DETECTED
Benzodiazepine, Ur Scrn: NOT DETECTED
Cannabinoid 50 Ng, Ur ~~LOC~~: POSITIVE — AB
Cocaine Metabolite,Ur ~~LOC~~: NOT DETECTED
MDMA (Ecstasy)Ur Screen: NOT DETECTED
Methadone Scn, Ur: NOT DETECTED
Opiate, Ur Screen: NOT DETECTED
Phencyclidine (PCP) Ur S: NOT DETECTED
Tricyclic, Ur Screen: NOT DETECTED

## 2023-01-31 LAB — GLUCOSE, CAPILLARY: Glucose-Capillary: 93 mg/dL (ref 70–99)

## 2023-01-31 SURGERY — CHOLECYSTECTOMY, ROBOT-ASSISTED, LAPAROSCOPIC
Anesthesia: General

## 2023-01-31 MED ORDER — OMEPRAZOLE MAGNESIUM 20 MG PO TBEC: 20.0000 mg | DELAYED_RELEASE_TABLET | Freq: Every day | ORAL | 2 refills | Status: DC

## 2023-01-31 MED ORDER — INDOCYANINE GREEN 25 MG IV SOLR
1.2500 mg | Freq: Once | INTRAVENOUS | Status: DC
  Filled 2023-01-31: qty 10

## 2023-01-31 MED ORDER — ACETAMINOPHEN 500 MG PO TABS
ORAL_TABLET | ORAL | Status: AC
  Administered 2023-01-31: 1000 mg via ORAL
  Filled 2023-01-31: qty 2

## 2023-01-31 MED ORDER — PHENYLEPHRINE HCL-NACL 20-0.9 MG/250ML-% IV SOLN
INTRAVENOUS | Status: AC
  Filled 2023-01-31: qty 250

## 2023-01-31 MED ORDER — BUPIVACAINE LIPOSOME 1.3 % IJ SUSP
INTRAMUSCULAR | Status: AC
  Filled 2023-01-31: qty 20

## 2023-01-31 MED ORDER — CELECOXIB 200 MG PO CAPS
ORAL_CAPSULE | ORAL | Status: AC
  Administered 2023-01-31: 200 mg via ORAL
  Filled 2023-01-31: qty 1

## 2023-01-31 MED ORDER — EPINEPHRINE PF 1 MG/ML IJ SOLN
INTRAMUSCULAR | Status: AC
  Filled 2023-01-31: qty 1

## 2023-01-31 MED ORDER — CIPROFLOXACIN IN D5W 400 MG/200ML IV SOLN: 400.0000 mg | INTRAVENOUS | Status: DC

## 2023-01-31 MED ORDER — FENTANYL CITRATE (PF) 100 MCG/2ML IJ SOLN
INTRAMUSCULAR | Status: AC
  Filled 2023-01-31: qty 2

## 2023-01-31 MED ORDER — CIPROFLOXACIN IN D5W 400 MG/200ML IV SOLN
INTRAVENOUS | Status: AC
  Filled 2023-01-31: qty 200

## 2023-01-31 MED ORDER — GABAPENTIN 300 MG PO CAPS
ORAL_CAPSULE | ORAL | Status: AC
  Administered 2023-01-31: 300 mg via ORAL
  Filled 2023-01-31: qty 1

## 2023-01-31 MED ORDER — IOHEXOL 300 MG/ML  SOLN
100.0000 mL | Freq: Once | INTRAMUSCULAR | Status: AC | PRN
  Administered 2023-01-31: 100 mL via ORAL

## 2023-01-31 MED ORDER — CELECOXIB 200 MG PO CAPS: 200.0000 mg | ORAL_CAPSULE | Freq: Once | ORAL | Status: AC

## 2023-01-31 MED ORDER — MIDAZOLAM HCL 2 MG/2ML IJ SOLN
INTRAMUSCULAR | Status: AC
  Filled 2023-01-31: qty 2

## 2023-01-31 MED ORDER — BUPIVACAINE HCL (PF) 0.25 % IJ SOLN
INTRAMUSCULAR | Status: AC
  Filled 2023-01-31: qty 30

## 2023-01-31 MED ORDER — CHLORHEXIDINE GLUCONATE 0.12 % MT SOLN
OROMUCOSAL | Status: AC
  Administered 2023-01-31: 15 mL
  Filled 2023-01-31: qty 15

## 2023-01-31 MED ORDER — ACETAMINOPHEN 500 MG PO TABS: 1000.0000 mg | ORAL_TABLET | Freq: Once | ORAL | Status: AC

## 2023-01-31 MED ORDER — CELECOXIB 200 MG PO CAPS: 200.0000 mg | ORAL_CAPSULE | ORAL | Status: DC

## 2023-01-31 MED ORDER — GABAPENTIN 300 MG PO CAPS: 300.0000 mg | ORAL_CAPSULE | Freq: Once | ORAL | Status: AC

## 2023-01-31 MED ORDER — BUPIVACAINE LIPOSOME 1.3 % IJ SUSP: 20.0000 mL | Freq: Once | INTRAMUSCULAR | Status: DC

## 2023-01-31 MED ORDER — GABAPENTIN 300 MG PO CAPS: 300.0000 mg | ORAL_CAPSULE | ORAL | Status: DC

## 2023-01-31 MED ORDER — ACETAMINOPHEN 500 MG PO TABS: 1000.0000 mg | ORAL_TABLET | ORAL | Status: DC

## 2023-01-31 MED ORDER — CHLORHEXIDINE GLUCONATE CLOTH 2 % EX PADS: 6.0000 | MEDICATED_PAD | Freq: Once | CUTANEOUS | Status: DC

## 2023-01-31 MED ORDER — IOHEXOL 300 MG/ML  SOLN: 75.0000 mL | Freq: Once | INTRAMUSCULAR | Status: DC | PRN

## 2023-01-31 SURGICAL SUPPLY — 45 items
BAG PRESSURE INF REUSE 3000 (BAG) IMPLANT
CLIP LIGATING HEM O LOK PURPLE (MISCELLANEOUS) ×1 IMPLANT
COVER TIP SHEARS 8 DVNC (MISCELLANEOUS) ×1 IMPLANT
COVER TIP SHEARS 8MM DA VINCI (MISCELLANEOUS) ×1
DERMABOND ADVANCED .7 DNX12 (GAUZE/BANDAGES/DRESSINGS) ×1 IMPLANT
DRAPE ARM DVNC X/XI (DISPOSABLE) ×4 IMPLANT
DRAPE COLUMN DVNC XI (DISPOSABLE) ×1 IMPLANT
DRAPE DA VINCI XI ARM (DISPOSABLE) ×4
DRAPE DA VINCI XI COLUMN (DISPOSABLE) ×1
ELECT CAUTERY BLADE 6.4 (BLADE) ×1 IMPLANT
GLOVE ORTHO TXT STRL SZ7.5 (GLOVE) ×2 IMPLANT
GOWN STRL REUS W/ TWL LRG LVL3 (GOWN DISPOSABLE) ×2 IMPLANT
GOWN STRL REUS W/ TWL XL LVL3 (GOWN DISPOSABLE) ×2 IMPLANT
GOWN STRL REUS W/TWL LRG LVL3 (GOWN DISPOSABLE) ×2
GOWN STRL REUS W/TWL XL LVL3 (GOWN DISPOSABLE) ×2
GRASPER SUT TROCAR 14GX15 (MISCELLANEOUS) ×1 IMPLANT
IRRIGATION STRYKERFLOW (MISCELLANEOUS) IMPLANT
IRRIGATOR STRYKERFLOW (MISCELLANEOUS)
IRRIGATOR SUCT 8 DISP DVNC XI (IRRIGATION / IRRIGATOR) IMPLANT
IRRIGATOR SUCTION 8MM XI DISP (IRRIGATION / IRRIGATOR)
IV NS IRRIG 3000ML ARTHROMATIC (IV SOLUTION) IMPLANT
KIT PINK PAD W/HEAD ARE REST (MISCELLANEOUS) ×1
KIT PINK PAD W/HEAD ARM REST (MISCELLANEOUS) ×1 IMPLANT
KIT TURNOVER KIT A (KITS) ×1 IMPLANT
LABEL OR SOLS (LABEL) ×1 IMPLANT
MANIFOLD NEPTUNE II (INSTRUMENTS) ×1 IMPLANT
NEEDLE HYPO 22GX1.5 SAFETY (NEEDLE) ×1 IMPLANT
NEEDLE INSUFFLATION 14GA 120MM (NEEDLE) ×1 IMPLANT
NS IRRIG 500ML POUR BTL (IV SOLUTION) ×1 IMPLANT
PACK LAP CHOLECYSTECTOMY (MISCELLANEOUS) ×1 IMPLANT
SEAL CANN UNIV 5-8 DVNC XI (MISCELLANEOUS) ×4 IMPLANT
SEAL XI 5MM-8MM UNIVERSAL (MISCELLANEOUS) ×4
SET TUBE SMOKE EVAC HIGH FLOW (TUBING) ×1 IMPLANT
SOL ELECTROSURG ANTI STICK (MISCELLANEOUS) ×1
SOLUTION ELECTROSURG ANTI STCK (MISCELLANEOUS) ×1 IMPLANT
SPIKE FLUID TRANSFER (MISCELLANEOUS) ×1 IMPLANT
SUT MNCRL 4-0 (SUTURE) ×1
SUT MNCRL 4-0 27XMFL (SUTURE) ×1
SUT VICRYL 0 UR6 27IN ABS (SUTURE) ×1 IMPLANT
SUTURE MNCRL 4-0 27XMF (SUTURE) ×1 IMPLANT
SYS BAG RETRIEVAL 10MM (BASKET) ×1
SYSTEM BAG RETRIEVAL 10MM (BASKET) ×1 IMPLANT
TRAP FLUID SMOKE EVACUATOR (MISCELLANEOUS) ×1 IMPLANT
TROCAR Z-THREAD FIOS 11X100 BL (TROCAR) ×1 IMPLANT
WATER STERILE IRR 500ML POUR (IV SOLUTION) ×1 IMPLANT

## 2023-01-31 NOTE — Anesthesia Preprocedure Evaluation (Deleted)
Anesthesia Evaluation  Patient identified by MRN, date of birth, ID band Patient awake    Reviewed: Allergy & Precautions, H&P , NPO status , Patient's Chart, lab work & pertinent test results  History of Anesthesia Complications (+) PROLONGED EMERGENCE and history of anesthetic complications  Airway Mallampati: II  TM Distance: >3 FB Neck ROM: full    Dental no notable dental hx.    Pulmonary sleep apnea , Current Smoker and Patient abstained from smoking.   Pulmonary exam normal        Cardiovascular hypertension, Normal cardiovascular exam     Neuro/Psych negative neurological ROS  negative psych ROS   GI/Hepatic Neg liver ROS,GERD  ,,  Endo/Other  diabetes    Renal/GU      Musculoskeletal   Abdominal   Peds  Hematology negative hematology ROS (+)   Anesthesia Other Findings Past Medical History: No date: Diabetes mellitus without complication (HCC) No date: Family history of adverse reaction to anesthesia     Comment:  "allergic to anesthesia" difficult to wake up No date: GERD (gastroesophageal reflux disease) No date: Heart murmur No date: Hypertension No date: Leaky heart valve No date: Polysubstance abuse ([place])     Comment:  a.) THC + cocaine No date: Sleep apnea No date: Syphilis  Past Surgical History: 2018: SOFT TISSUE MASS EXCISION     Comment:  back of head No date: WISDOM TOOTH EXTRACTION     Reproductive/Obstetrics negative OB ROS                             Anesthesia Physical Anesthesia Plan  ASA: 2  Anesthesia Plan: General ETT   Post-op Pain Management:    Induction:   PONV Risk Score and Plan: 2 and Ondansetron, Dexamethasone and Midazolam  Airway Management Planned: Oral ETT  Additional Equipment:   Intra-op Plan:   Post-operative Plan:   Informed Consent:      Dental Advisory Given  Plan Discussed with: CRNA and  Surgeon  Anesthesia Plan Comments:         Anesthesia Quick Evaluation

## 2023-01-31 NOTE — Progress Notes (Signed)
PPI for GERD  Referral to GI.

## 2023-01-31 NOTE — Interval H&P Note (Signed)
History and Physical Interval Note:  01/31/2023 1:55 PM  Cameron Watkins  has presented today for surgery, with the diagnosis of chronic calculous cholecystitis.  The various methods of treatment have been discussed with the patient and family. After consideration of risks, benefits and other options for treatment, the patient has consented to  Procedure(s): XI ROBOTIC ASSISTED LAPAROSCOPIC CHOLECYSTECTOMY (N/A) [place] (ICG) (N/A) as a surgical intervention.  The patient's history has been reviewed, patient examined, no change in status, stable for surgery.  I have reviewed the patient's chart and labs.  Questions were answered to the patient's satisfaction.     Ronny Bacon

## 2023-01-31 NOTE — H&P (Signed)
Chief Complaint: Right upper quadrant pain   History of Present Illness Cameron Watkins is a 52 y.o. male with right upper quadrant pain since October, associated postprandial belching.  Reports nausea and vomiting.  Denies abdominal pain radiation.  Has had multiple episodes since his last ED visit of January 4.  He did report having some associated diaphoresis December.  Denies any history hepatitis or jaundice.  Never instructed on avoidance of fatty foods.  Avoided eating today for concerns regarding exacerbating pain prior to presentation.   Past Medical History     Past Medical History:  Diagnosis Date   Diabetes mellitus without complication ([place])     Hypertension          History reviewed. No pertinent surgical history.        Allergies  Allergen Reactions   Green Dye Swelling, Rash and Other (See Comments)      "Hair dye" makes head swell, eyes swell shut, rash on head   Bee Venom Swelling   Penicillins Hives            Current Outpatient Medications  Medication Sig Dispense Refill   aspirin EC 81 MG tablet Take 81 mg by mouth daily. Swallow whole.       metoprolol succinate (TOPROL-XL) 50 MG 24 hr tablet Take 50 mg by mouth daily.       ondansetron (ZOFRAN-ODT) 4 MG disintegrating tablet Take 1 tablet (4 mg total) by mouth every 8 (eight) hours as needed for nausea or vomiting. 30 tablet 0   oxyCODONE-acetaminophen (PERCOCET/ROXICET) 5-325 MG tablet Take 1 tablet by mouth every 4 (four) hours as needed for severe pain. 30 tablet 0   lisinopril (ZESTRIL) 10 MG tablet Take 1 tablet (10 mg total) by mouth daily. 30 tablet 1   metFORMIN (GLUCOPHAGE) 500 MG tablet Take 1 tablet (500 mg total) by mouth 2 (two) times daily with a meal. 60 tablet 1    No current facility-administered medications for this visit.      Family History History reviewed. No pertinent family history.      Social History Social History         Tobacco Use   Smoking status: Every Day       Packs/day: 0.50      Types: Cigarettes   Smokeless tobacco: Never  Vaping Use   Vaping Use: Never used  Substance Use Topics   Alcohol use: Not Currently      Alcohol/week: 4.0 standard drinks of alcohol      Types: 4 Shots of liquor per week   Drug use: Not Currently      Types: Marijuana          Physical Exam Blood pressure 132/88, pulse 93, temperature 98.5 F (36.9 C), temperature source Oral, height 6' 3.25" (1.911 m), weight 280 lb (127 kg), SpO2 98 %. Last Weight  Most recent update: 12/26/2022 10:02 AM      Weight  127 kg (280 lb)                     CONSTITUTIONAL: Well developed, and nourished, appropriately responsive and aware without distress.   EYES: Sclera non-icteric.   EARS, NOSE, MOUTH AND THROAT:  The oropharynx is clear. Oral mucosa is pink and moist.   Hearing is intact to voice.  NECK: Trachea is midline, and there is no jugular venous distension.  LYMPH NODES:  Lymph nodes in the neck are not appreciated. RESPIRATORY:  Lungs are  clear, and breath sounds are equal bilaterally. Normal respiratory effort without pathologic use of accessory muscles. CARDIOVASCULAR: Heart is regular in rate and rhythm.  Well perfused.  GI: The abdomen is soft, nontender, and nondistended. There were no palpable masses. I did not appreciate hepatosplenomegaly. There were normal bowel sounds. MUSCULOSKELETAL:  Symmetrical muscle tone appreciated in all four extremities.    SKIN: Skin turgor is normal. No pathologic skin lesions appreciated.  NEUROLOGIC:  Motor and sensation appear grossly normal.  Cranial nerves are grossly without defect. PSYCH:  Alert and oriented to person, place and time. Affect is appropriate for situation.   Data Reviewed I have personally reviewed what is currently available of the patient's imaging, recent labs and medical records.   Labs:      Latest Ref Rng & Units 12/18/2022    9:49 PM 12/25/2021   11:48 AM 12/24/2021    7:24 PM  CBC  WBC 4.0 -  10.5 K/uL 5.2  3.6  4.7   Hemoglobin 13.0 - 17.0 g/dL 12.5  14.0  13.5   Hematocrit 39.0 - 52.0 % 38.9  42.9  41.7   Platelets 150 - 400 K/uL 268  280  287         Latest Ref Rng & Units 12/18/2022    9:49 PM 12/25/2021   11:48 AM 12/24/2021    7:24 PM  CMP  Glucose 70 - 99 mg/dL 143  151  86   BUN 6 - 20 mg/dL 14  12  14   $ Creatinine 0.61 - 1.24 mg/dL 0.94  1.08  0.90   Sodium 135 - 145 mmol/L 137  132  135   Potassium 3.5 - 5.1 mmol/L 3.1  3.4  3.2   Chloride 98 - 111 mmol/L 105  100  101   CO2 22 - 32 mmol/L 23  25  28   $ Calcium 8.9 - 10.3 mg/dL 8.6  9.0  9.5   Total Protein 6.5 - 8.1 g/dL 7.2       Total Bilirubin 0.3 - 1.2 mg/dL 0.8       Alkaline Phos 38 - 126 U/L 78       AST 15 - 41 U/L 29       ALT 0 - 44 U/L 51         Imaging: Radiological images reviewed:  CLINICAL DATA:  Epigastric pain   EXAM: ULTRASOUND ABDOMEN LIMITED RIGHT UPPER QUADRANT   COMPARISON:  None Available.   FINDINGS: Gallbladder:   Gallbladder is contracted with gallstones within. No pericholecystic fluid is noted. Negative sonographic Murphy's sign is noted. Gallbladder wall is thickened although related to the decompressed state.   Common bile duct:   Diameter: 3.3 mm.   Liver:   Increased in echogenicity consistent with fatty infiltration. No focal mass is noted. Portal vein is patent on color Doppler imaging with normal direction of blood flow towards the liver.   Other: None.   IMPRESSION: Fatty liver.   Decompressed gallbladder with scattered gallstones.     Electronically Signed   By: Inez Catalina M.D.   On: 12/19/2022 02:27 Within last 24 hrs: No results found.   Assessment   Assessment     Patient Active Problem List    Diagnosis Date Noted   CCC (chronic calculous cholecystitis) 12/26/2022   Syphilis 11/29/2022   Diabetes mellitus without complication ([place]) [id number]      Plan     Plan This was discussed thoroughly.  Optimal plan  is for robotic  cholecystectomy utilizing ICG imaging. Risks and benefits have been discussed with the patient which include but are not limited to anesthesia, bleeding, infection, biliary ductal injury, resulting in leak or stenosis, other associated unanticipated injuries affiliated with laparoscopic surgery.   Reviewed that removing the gallbladder will only address the symptoms related to the gallbladder itself.  I believe there is the desire to proceed, accepting the risks with understanding.  Questions elicited and answered to satisfaction.    No guarantees ever expressed or implied.     Face-to-face time spent with the patient and accompanying care providers(if present) was 45 minutes, with more than 50% of the time spent counseling, educating, and coordinating care of the patient.

## 2023-01-31 NOTE — Progress Notes (Signed)
Pt remembered having GB out as a teenager.   CXR reveals probable GB clips, and review of u/s is cw fat sparing of the GB fossa, and the clips were presumed to be gallstones.    CT c/w post-cholecyestectomy.   CLINICAL DATA:  Epigastric pain   EXAM: CT ABDOMEN WITH CONTRAST   TECHNIQUE: Multidetector CT imaging of the abdomen was performed using the standard protocol following bolus administration of intravenous contrast.   RADIATION DOSE REDUCTION: This exam was performed according to the departmental dose-optimization program which includes automated exposure control, adjustment of the mA and/or kV according to patient size and/or use of iterative reconstruction technique.   CONTRAST:  193m OMNIPAQUE IOHEXOL 300 MG/ML  SOLN   COMPARISON:  None Available.   FINDINGS: Lower chest:  Lung bases are clear.   Hepatobiliary: No focal hepatic lesion. Postcholecystectomy. No biliary dilatation.   Pancreas: Normal pancreatic parenchymal intensity. No ductal dilatation or inflammation.   Spleen: Normal spleen.   Adrenals/urinary tract: Adrenal glands and kidneys are normal.   Stomach/Bowel: Stomach and limited of the small bowel is unremarkable   Vascular/Lymphatic: Abdominal aortic normal caliber. No retroperitoneal periportal lymphadenopathy.   Musculoskeletal: No aggressive osseous lesion   IMPRESSION: 1. No acute findings in the abdomen. 2. Postcholecystectomy.     Electronically Signed   By: SSuzy BouchardM.D.   On: 01/31/2023 15:01  Will Rx PPI for probable GERD symptoms, and make referral to GI fo follow up EGD.

## 2023-01-31 NOTE — OR Nursing (Signed)
Dr. Christian Mate has cancelled surgery after review of CT scan. Patient is to pick up prescription from the pharmacy.

## 2023-02-05 ENCOUNTER — Encounter: Payer: Self-pay | Admitting: Surgery

## 2023-02-17 ENCOUNTER — Other Ambulatory Visit: Payer: Self-pay

## 2023-02-21 ENCOUNTER — Ambulatory Visit: Payer: Self-pay | Admitting: Cardiovascular Disease

## 2023-04-06 ENCOUNTER — Emergency Department
Admission: EM | Admit: 2023-04-06 | Discharge: 2023-04-07 | Disposition: A | Discharge status: Home / Self Care | Payer: Self-pay | Attending: Emergency Medicine | Admitting: Emergency Medicine

## 2023-04-06 ENCOUNTER — Emergency Department: Payer: Self-pay

## 2023-04-06 ENCOUNTER — Other Ambulatory Visit: Payer: Self-pay

## 2023-04-06 DIAGNOSIS — R002 Palpitations: Secondary | ICD-10-CM | POA: Insufficient documentation

## 2023-04-06 DIAGNOSIS — R55 Syncope and collapse: Secondary | ICD-10-CM | POA: Insufficient documentation

## 2023-04-06 DIAGNOSIS — F419 Anxiety disorder, unspecified: Secondary | ICD-10-CM | POA: Insufficient documentation

## 2023-04-06 DIAGNOSIS — R079 Chest pain, unspecified: Secondary | ICD-10-CM | POA: Insufficient documentation

## 2023-04-06 DIAGNOSIS — M545 Low back pain, unspecified: Secondary | ICD-10-CM | POA: Insufficient documentation

## 2023-04-06 DIAGNOSIS — E119 Type 2 diabetes mellitus without complications: Secondary | ICD-10-CM | POA: Insufficient documentation

## 2023-04-06 LAB — TROPONIN I (HIGH SENSITIVITY): Troponin I (High Sensitivity): 5 ng/L (ref <18)

## 2023-04-06 LAB — CBC WITH DIFFERENTIAL/PLATELET
Abs Immature Granulocytes: 0.01 10*3/uL (ref 0.00–0.07)
Basophils Absolute: 0 10*3/uL (ref 0.0–0.1)
Basophils Relative: 1 %
Eosinophils Absolute: 0.1 10*3/uL (ref 0.0–0.5)
Eosinophils Relative: 1 %
HCT: 42.7 % (ref 39.0–52.0)
Hemoglobin: 13.3 g/dL (ref 13.0–17.0)
Immature Granulocytes: 0 %
Lymphocytes Relative: 63 %
Lymphs Abs: 2.7 10*3/uL (ref 0.7–4.0)
MCH: 26.5 pg (ref 26.0–34.0)
MCHC: 31.1 g/dL (ref 30.0–36.0)
MCV: 85.1 fL (ref 80.0–100.0)
Monocytes Absolute: 0.4 10*3/uL (ref 0.1–1.0)
Monocytes Relative: 10 %
Neutro Abs: 1.1 10*3/uL — ABNORMAL LOW (ref 1.7–7.7)
Neutrophils Relative %: 25 %
Platelets: 315 10*3/uL (ref 150–400)
RBC: 5.02 MIL/uL (ref 4.22–5.81)
RDW: 12.6 % (ref 11.5–15.5)
WBC: 4.3 10*3/uL (ref 4.0–10.5)
nRBC: 0 % (ref 0.0–0.2)

## 2023-04-06 LAB — COMPREHENSIVE METABOLIC PANEL
ALT: 48 U/L — ABNORMAL HIGH (ref 0–44)
AST: 29 U/L (ref 15–41)
Albumin: 4.1 g/dL (ref 3.5–5.0)
Alkaline Phosphatase: 85 U/L (ref 38–126)
Anion gap: 6 (ref 5–15)
BUN: 8 mg/dL (ref 6–20)
CO2: 26 mmol/L (ref 22–32)
Calcium: 8.7 mg/dL — ABNORMAL LOW (ref 8.9–10.3)
Chloride: 105 mmol/L (ref 98–111)
Creatinine, Ser: 0.92 mg/dL (ref 0.61–1.24)
GFR, Estimated: >60 mL/min (ref >60)
Glucose, Bld: 97 mg/dL (ref 70–99)
Potassium: 3.5 mmol/L (ref 3.5–5.1)
Sodium: 137 mmol/L (ref 135–145)
Total Bilirubin: 0.8 mg/dL (ref 0.3–1.2)
Total Protein: 7.7 g/dL (ref 6.5–8.1)

## 2023-04-06 LAB — URINALYSIS, COMPLETE (UACMP) WITH MICROSCOPIC
Bacteria, UA: NONE SEEN
Bilirubin Urine: NEGATIVE
Glucose, UA: NEGATIVE mg/dL
Hgb urine dipstick: NEGATIVE
Ketones, ur: NEGATIVE mg/dL
Leukocytes,Ua: NEGATIVE
Nitrite: NEGATIVE
Protein, ur: NEGATIVE mg/dL
Specific Gravity, Urine: 1.005 (ref 1.005–1.030)
pH: 6 (ref 5.0–8.0)

## 2023-04-06 LAB — LIPASE, BLOOD: Lipase: 44 U/L (ref 11–51)

## 2023-04-06 LAB — T4, FREE: Free T4: 0.84 ng/dL (ref 0.61–1.12)

## 2023-04-06 LAB — TSH: TSH: 1.122 u[IU]/mL (ref 0.350–4.500)

## 2023-04-06 MED ORDER — FAMOTIDINE 20 MG PO TABS
20.0000 mg | ORAL_TABLET | Freq: Once | ORAL | Status: AC
  Administered 2023-04-06: 20 mg via ORAL
  Filled 2023-04-06: qty 1

## 2023-04-06 MED ORDER — ALUM & MAG HYDROXIDE-SIMETH 200-200-20 MG/5ML PO SUSP
15.0000 mL | Freq: Once | ORAL | Status: AC
  Administered 2023-04-06: 15 mL via ORAL
  Filled 2023-04-06: qty 30

## 2023-04-06 MED ORDER — DICYCLOMINE HCL 10 MG PO CAPS
10.0000 mg | ORAL_CAPSULE | Freq: Once | ORAL | Status: AC
  Administered 2023-04-06: 10 mg via ORAL
  Filled 2023-04-06: qty 1

## 2023-04-06 NOTE — Discharge Instructions (Addendum)
Please follow-up with Dr. Lennette Bihari, your cardiologist in the next couple days.  Please call to schedule a clinic visit with him.  Please follow-up with your primary care doctor as well as cardiology, we recommend you stop use of lisinopril which we are not certain but may be causing symptoms of a feeling of intermittent discomfort or swelling in your throat.  Return to the emergency room right away if you have facial swelling, difficulty breathing, severe chest pain, weakness, passout or other new concerns arise

## 2023-04-06 NOTE — ED Notes (Signed)
Pt appears very anxious, sts he feels like "I'm gonna pass out" all the time. Pt ambulating with no assistance with steady gait but sts he would prefer a wheelchair.

## 2023-04-06 NOTE — ED Provider Notes (Signed)
[facility] Provider Note    Event Date/Time   First MD Initiated Contact with Patient 04/06/23 2103     (approximate)   History   Anxiety, Back Pain, Chest Pain, and Near Syncope   HPI  Cameron Watkins is a 52 y.o. male history of diabetes.  He reports previous cardiac workup per Dr. Welton Flakes, his cardiologist. Denies history of heart disease except for a minor 'leaky valve' and underwent a stress echo about 1 year ago.  Today he reports for about 1 week he has had intermittent belching and episodes where he will feel like his heart will be racing, strange sensation in his throat, that comes off and on.  He reports the use of metoprolol and lisinopril  No chest pain or trouble breathing.  Reports the symptoms are intermittent.  At times it makes him feel hot and sweaty then goes away.  Checked his temperature at home and advised it was 96 Fahrenheit.    Review of medical records including February 16 of this year denotes a history of diabetes mellitus and hypertension.  Cholecystectomy  Physical Exam   Triage Vital Signs: ED Triage Vitals [04/06/23 2005]  Enc Vitals Group     BP (!) 148/77     Pulse Rate 74     Resp 18     Temp 99.2 F (37.3 C)     Temp Source Oral     SpO2 100 %     Weight      Height      Head Circumference      Peak Flow      Pain Score 1     Pain Loc      Pain Edu?      Excl. in GC?     Most recent vital signs: Vitals:   04/06/23 2005 04/06/23 2130  BP: (!) 148/77 131/73  Pulse: 74 70  Resp: 18 15  Temp: 99.2 F (37.3 C)   SpO2: 100% 99%     General: Awake, no distress.  CV:  Good peripheral perfusion.  Normal tones and rate.  No noted murmur Resp:  Normal effort.  Clear bilaterally.  Normal work of breathing Posterior oropharynx widely patent.  No lesions no erythema.  No fullness or tenderness to the anterior neck or thyroid noted Abd:  No distention.  Soft nontender nondistended Other:  No noted lower  extremity edema Face atraumatic.  No angioedema   ED Results / Procedures / Treatments   Labs (all labs ordered are listed, but only abnormal results are displayed) Labs Reviewed  CBC WITH DIFFERENTIAL/PLATELET - Abnormal; Notable for the following components:      Result Value   Neutro Abs 1.1 (*)    All other components within normal limits  COMPREHENSIVE METABOLIC PANEL - Abnormal; Notable for the following components:   Calcium 8.7 (*)    ALT 48 (*)    All other components within normal limits  URINALYSIS, COMPLETE (UACMP) WITH MICROSCOPIC - Abnormal; Notable for the following components:   Color, Urine STRAW (*)    APPearance CLEAR (*)    All other components within normal limits  LIPASE, BLOOD  TSH  T4, FREE  TROPONIN I (HIGH SENSITIVITY)  TROPONIN I (HIGH SENSITIVITY)     EKG  EKG interpreted by me at 8:05 PM heart rate 80 QRS 80 QTc 420 Normal sinus rhythm no evidence of acute ischemia.   RADIOLOGY  Chest x-ray interpreted by me as negative for  acute   PROCEDURES:  Critical Care performed: No  Procedures   MEDICATIONS ORDERED IN ED: Medications  alum & mag hydroxide-simeth (MAALOX/MYLANTA) 200-200-20 MG/5ML suspension 15 mL (15 mLs Oral Given 04/06/23 2134)  famotidine (PEPCID) tablet 20 mg (20 mg Oral Given 04/06/23 2133)  dicyclomine (BENTYL) capsule 10 mg (10 mg Oral Given 04/06/23 2133)     IMPRESSION / MDM / ASSESSMENT AND PLAN / ED COURSE  I reviewed the triage vital signs and the nursing notes.                              Differential diagnosis includes, but is not limited to, possible palpitation, exclude hyperthyroid, gastritis, GERD, reflux, dyspepsia, atypical symptoms and presentation for acute coronary syndrome.  No dyspnea no shortness of breath no symptoms that would be particularly concerning for pulmonary embolism.  No noted risk factors.  Patient awake oriented currently asymptomatic.  Having intermittent symptoms of belching, heart  racing, feeling sweaty off-and-on for about a week now.  Taking omeprazole without relief  Benign abdominal exam.  Very reassuring examination at this point.  Patient reports symptoms very are very intermittent.  Patient's presentation is most consistent with acute complicated illness / injury requiring diagnostic workup.  Comprehensive metabolic panel normal with exception of mildly elevated ALT.  Normal troponin.  Urinalysis normal. CBC noted to be of normal white count, may be slightly reduced absolute neutrophil count.  No clear clinical significance noted at this time, recommend follow-up with primary care  When he is afebrile.  No clearly infectious symptoms.   The patient is on the cardiac monitor to evaluate for evidence of arrhythmia and/or significant heart rate changes.  Discussed with patient recommendation to follow-up with both his primary care as well as his cardiologist Dr. Lennette Bihari.  He is agreeable with this plan.  Ongoing care assigned to Dr. Deneen Harts.  Follow-up on repeat troponin.  If second troponin is normal anticipate discharge to follow-up with PCP for symptoms of intermittent palpitations, fatigue, and possible dyspepsia.      FINAL CLINICAL IMPRESSION(S) / ED DIAGNOSES   Final diagnoses:  Palpitations     Rx / DC Orders   ED Discharge Orders          Ordered    Ambulatory referral to Cardiology       Comments: If you have not heard from the Cardiology office within the next 72 hours please call [phone].   04/06/23 2353             Note:  This document was prepared using Dragon voice recognition software and may include unintentional dictation errors.   Sharyn Creamer, MD 04/07/23 0005

## 2023-04-06 NOTE — ED Triage Notes (Signed)
Pt sts he has been having a feeling of chest pain and gas for the last few days. Sts he went to the [place] [facility] and they told him he had indigestion. Sts he has been shaking and just not feeling well.

## 2023-04-07 LAB — TROPONIN I (HIGH SENSITIVITY): Troponin I (High Sensitivity): 3 ng/L

## 2023-04-07 NOTE — ED Provider Notes (Incomplete)
[facility] Provider Note    Event Date/Time   First MD Initiated Contact with Patient 04/06/23 2103     (approximate)   History   Anxiety, Back Pain, Chest Pain, and Near Syncope   HPI  Cameron Watkins is a 52 y.o. male history of diabetes.  He reports previous cardiac workup per Dr. Welton Flakes, his cardiologist. Denies history of heart disease except for a minor 'leaky valve' and underwent a stress echo about 1 year ago.  Today he reports for about 1 week he has had intermittent belching and episodes where he will feel like his heart will be racing, strange sensation in his throat, that comes off and on.  He reports the use of metoprolol and lisinopril  No chest pain or trouble breathing.  Reports the symptoms are intermittent.  At times it makes him feel hot and sweaty then goes away.  Checked his temperature at home and advised it was 96 Fahrenheit.    Review of medical records including February 16 of this year denotes a history of diabetes mellitus and hypertension.  Cholecystectomy  Physical Exam   Triage Vital Signs: ED Triage Vitals [04/06/23 2005]  Enc Vitals Group     BP (!) 148/77     Pulse Rate 74     Resp 18     Temp 99.2 F (37.3 C)     Temp Source Oral     SpO2 100 %     Weight      Height      Head Circumference      Peak Flow      Pain Score 1     Pain Loc      Pain Edu?      Excl. in GC?     Most recent vital signs: Vitals:   04/06/23 2005 04/06/23 2130  BP: (!) 148/77 131/73  Pulse: 74 70  Resp: 18 15  Temp: 99.2 F (37.3 C)   SpO2: 100% 99%     General: Awake, no distress.  CV:  Good peripheral perfusion.  Normal tones and rate.  No noted murmur Resp:  Normal effort.  Clear bilaterally.  Normal work of breathing Posterior oropharynx widely patent.  No lesions no erythema.  No fullness or tenderness to the anterior neck or thyroid noted Abd:  No distention.  Soft nontender nondistended Other:  No noted lower  extremity edema Face atraumatic.  No angioedema   ED Results / Procedures / Treatments   Labs (all labs ordered are listed, but only abnormal results are displayed) Labs Reviewed  CBC WITH DIFFERENTIAL/PLATELET - Abnormal; Notable for the following components:      Result Value   Neutro Abs 1.1 (*)    All other components within normal limits  COMPREHENSIVE METABOLIC PANEL - Abnormal; Notable for the following components:   Calcium 8.7 (*)    ALT 48 (*)    All other components within normal limits  URINALYSIS, COMPLETE (UACMP) WITH MICROSCOPIC - Abnormal; Notable for the following components:   Color, Urine STRAW (*)    APPearance CLEAR (*)    All other components within normal limits  LIPASE, BLOOD  TSH  T4, FREE  TROPONIN I (HIGH SENSITIVITY)  TROPONIN I (HIGH SENSITIVITY)     EKG  EKG interpreted by me at 8:05 PM heart rate 80 QRS 80 QTc 420 Normal sinus rhythm no evidence of acute ischemia.   RADIOLOGY  Chest x-ray interpreted by me as negative for  acute   PROCEDURES:  Critical Care performed: No  Procedures   MEDICATIONS ORDERED IN ED: Medications  alum & mag hydroxide-simeth (MAALOX/MYLANTA) 200-200-20 MG/5ML suspension 15 mL (15 mLs Oral Given 04/06/23 2134)  famotidine (PEPCID) tablet 20 mg (20 mg Oral Given 04/06/23 2133)  dicyclomine (BENTYL) capsule 10 mg (10 mg Oral Given 04/06/23 2133)     IMPRESSION / MDM / ASSESSMENT AND PLAN / ED COURSE  I reviewed the triage vital signs and the nursing notes.                              Differential diagnosis includes, but is not limited to, ***  Patient's presentation is most consistent with {EM COPA:27473}  Comprehensive metabolic panel normal with exception of mildly elevated ALT.  Normal troponin.  Urinalysis normal. CBC ***  *** {If the patient is on the monitor, remove the brackets and asterisks on the sentence below and remember to document it as a Procedure as well. Otherwise delete the  sentence below:1} {**The patient is on the cardiac monitor to evaluate for evidence of arrhythmia and/or significant heart rate changes.**} {Remember to include, when applicable, any/all of the following data: independent review of imaging independent review of labs (comment specifically on pertinent positives and negatives) review of specific prior hospitalizations, PCP/specialist notes, etc. discuss meds given and prescribed document any discussion with consultants (including hospitalists) any clinical decision tools you used and why (PECARN, NEXUS, etc.) did you consider admitting the patient? document social determinants of health affecting patient's care (homelessness, inability to follow up in a timely fashion, etc) document any pre-existing conditions increasing risk on current visit (e.g. diabetes and HTN increasing danger of high-risk chest pain/ACS) describes what meds you gave (especially parenteral) and why any other interventions?:1}     FINAL CLINICAL IMPRESSION(S) / ED DIAGNOSES   Final diagnoses:  Palpitations     Rx / DC Orders   ED Discharge Orders          Ordered    Ambulatory referral to Cardiology       Comments: If you have not heard from the Cardiology office within the next 72 hours please call [phone].   04/06/23 2353             Note:  This document was prepared using Dragon voice recognition software and may include unintentional dictation errors.

## 2023-05-19 ENCOUNTER — Other Ambulatory Visit: Payer: Self-pay

## 2023-05-20 ENCOUNTER — Other Ambulatory Visit: Payer: Self-pay

## 2023-05-20 ENCOUNTER — Ambulatory Visit (INDEPENDENT_AMBULATORY_CARE_PROVIDER_SITE_OTHER): Payer: Self-pay | Admitting: Gastroenterology

## 2023-05-20 ENCOUNTER — Encounter: Payer: Self-pay | Admitting: Gastroenterology

## 2023-05-20 VITALS — BP 142/84 | HR 88 | Temp 98.3°F | Ht 75.0 in | Wt 288.0 lb

## 2023-05-20 DIAGNOSIS — Z1211 Encounter for screening for malignant neoplasm of colon: Secondary | ICD-10-CM

## 2023-05-20 DIAGNOSIS — R09A2 Foreign body sensation, throat: Secondary | ICD-10-CM

## 2023-05-20 DIAGNOSIS — K219 Gastro-esophageal reflux disease without esophagitis: Secondary | ICD-10-CM

## 2023-05-20 MED ORDER — NA SULFATE-K SULFATE-MG SULF 17.5-3.13-1.6 GM/177ML PO SOLN: 354.0000 mL | Freq: Once | ORAL | 0 refills | Status: AC

## 2023-05-20 MED ORDER — OMEPRAZOLE 40 MG PO CPDR: 40.0000 mg | DELAYED_RELEASE_CAPSULE | Freq: Two times a day (BID) | ORAL | 0 refills | Status: AC

## 2023-05-20 NOTE — Addendum Note (Signed)
Addended by: Adela Ports on: 05/20/2023 03:41 PM   Modules accepted: Orders

## 2023-05-20 NOTE — Patient Instructions (Addendum)

## 2023-05-20 NOTE — Progress Notes (Signed)
Cameron Mood MD, MRCP(U.K) [address]  [address]  [place], [place] [address]  Main: [phone]  Fax: [phone]   Gastroenterology Consultation  Referring Provider:     [name], MD Primary Care Physician:  Cameron Code, MD Primary Gastroenterologist:  Dr. Wyline Watkins  Reason for Consultation:     GERD        HPI:   Cameron Watkins is a 52 y.o. y/o male referred for consultation & management  by Dr. Welton Watkins, Cameron Harper, MD.     He has been referred for GERD.  Issues with difficulty in swallowing when she saw his primary care provider. 04/06/2023 hemoglobin 13.3 g.  Next time urine drug screen February 2024 detected cannabinoids and in February 2024 also found cocaine in the urine 01/31/2023 CT scan of the abdomen and pelvis with contrast showed no acute findings postcholecystectomy.  No recent endoscopic evaluation.  On oxycodone, omeprazole OTC  He states he has had issues with heartburn going on for a while.  Feels like there is a knot in his throat when he swallows.  He has lost some weight recently.  States he has not use any marijuana cocaine recently.  Had been given some medications for acid but he ran out of them and not taken any recently.  Never had a colonoscopy no family history of colon cancer or polyps no change in bowel habits. Past Medical History:  Diagnosis Date   Diabetes mellitus without complication (HCC)    Family history of adverse reaction to anesthesia    "allergic to anesthesia" difficult to wake up   GERD (gastroesophageal reflux disease)    Heart murmur    Hypertension    Leaky heart valve    Polysubstance abuse (HCC)    a.) THC + cocaine   Sleep apnea    Syphilis     Past Surgical History:  Procedure Laterality Date   CHOLECYSTECTOMY     SOFT TISSUE MASS EXCISION  2018   back of head   WISDOM TOOTH EXTRACTION      Prior to Admission medications   Medication Sig Start Date End Date Taking? Authorizing Provider  aspirin EC 81 MG  tablet Take 81 mg by mouth daily. Swallow whole.    [provider]  calcium carbonate (TUMS EX) 750 MG chewable tablet Chew 2 tablets by mouth as needed for heartburn.    [provider]  lisinopril-hydrochlorothiazide (ZESTORETIC) 10-12.5 MG tablet Take 1 tablet by mouth daily. 02/01/23   [provider]  metFORMIN (GLUCOPHAGE) 500 MG tablet Take 1 tablet (500 mg total) by mouth 2 (two) times daily with a meal. 12/25/21 01/13/23  Cameron Noon, MD  metoprolol succinate (TOPROL-XL) 50 MG 24 hr tablet Take 50 mg by mouth daily. 02/04/22   [provider]  omeprazole (PRILOSEC OTC) 20 MG tablet Take 1 tablet (20 mg total) by mouth daily. 01/31/23   Cameron Lerner, MD  ondansetron (ZOFRAN-ODT) 4 MG disintegrating tablet Take 1 tablet (4 mg total) by mouth every 8 (eight) hours as needed for nausea or vomiting. 12/19/22   Cameron Hong, MD  oxyCODONE-acetaminophen (PERCOCET/ROXICET) 5-325 MG tablet Take 1 tablet by mouth every 4 (four) hours as needed for severe pain. 12/19/22   Cameron Hong, MD    No family history on file.   Social History   Tobacco Use   Smoking status: Every Day    Packs/day: .5    Types: Cigarettes   Smokeless  tobacco: Never  Vaping Use   Vaping Use: Never used  Substance Use Topics   Alcohol use: Not Currently    Alcohol/week: 4.0 standard drinks of alcohol    Types: 4 Shots of liquor per week   Drug use: Not Currently    Types: Marijuana    Allergies as of 05/20/2023 - Review Complete 05/20/2023  Allergen Reaction Noted   Green dye Swelling, Rash, and Other (See Comments) 12/25/2021   Bee venom Swelling 08/05/2019   Penicillins Hives 08/05/2019    Review of Systems:    All systems reviewed and negative except where noted in HPI.   Physical Exam:  BP (!) 143/88   Pulse 80   Temp 98.3 F (36.8 C) (Oral)   Ht 6\' 3"  (1.905 m)   Wt 288 lb (130.6 kg)   BMI 36.00 kg/m  No LMP for male patient. Psych:  Alert and cooperative.  Normal Watkins and affect. General:   Alert,  Well-developed, well-nourished, pleasant and cooperative in NAD Head:  Normocephalic and atraumatic. Eyes:  Sclera clear, no icterus.   Conjunctiva pink. Ears:  Normal auditory acuity. Neck:  Supple; no masses or thyromegaly.    Neurologic:  Alert and oriented x3;  grossly normal neurologically. Psych:  Alert and cooperative. Normal Watkins and affect.  Imaging Studies: No results found.  Assessment and Plan:   Cameron Watkins is a 52 y.o. y/o male has been referred for GERD  Plan 1.  Stop usage of cocaine and marijuana as it can cause GI symptoms causing nausea vomiting reflux  2.  EGD to evaluate for significant heartburn and occasional difficulty in swallowing, colonoscopy colon cancer screening average risk 3.  Trial of PPI Prilosec 40 mg twice daily 4.  Counseled on lifestyle changes for GERD including weight loss avoid eating for 2 to 3 hours before bedtime keeping the head end of the bed elevated at night using a wedge pillow 5.  At next visit if doing well we will reduce the dose of PPI and eventually transition to Pepcid    I have discussed alternative options, risks & benefits,  which include, but are not limited to, bleeding, infection, perforation,respiratory complication & drug reaction.  The patient agrees with this plan & written consent will be obtained.     Follow up in 8 weeks with PA  Dr Cameron Mood MD,MRCP(U.K)

## 2023-05-29 ENCOUNTER — Other Ambulatory Visit: Payer: Self-pay

## 2023-05-29 MED ORDER — NA SULFATE-K SULFATE-MG SULF 17.5-3.13-1.6 GM/177ML PO SOLN: 354.0000 mL | Freq: Once | ORAL | 0 refills | Status: AC

## 2023-05-30 ENCOUNTER — Encounter: Payer: Self-pay | Admitting: Gastroenterology

## 2023-05-30 ENCOUNTER — Ambulatory Visit
Admission: RE | Admit: 2023-05-30 | Discharge: 2023-05-30 | Disposition: A | Discharge status: Home / Self Care | Payer: Self-pay | Attending: Gastroenterology | Admitting: Gastroenterology

## 2023-05-30 ENCOUNTER — Ambulatory Visit: Payer: Self-pay | Admitting: General Practice

## 2023-05-30 ENCOUNTER — Encounter
Admission: RE | Disposition: A | Discharge status: Home / Self Care | Payer: Self-pay | Source: Home / Self Care | Attending: Gastroenterology

## 2023-05-30 ENCOUNTER — Other Ambulatory Visit: Payer: Self-pay

## 2023-05-30 DIAGNOSIS — K640 First degree hemorrhoids: Secondary | ICD-10-CM | POA: Insufficient documentation

## 2023-05-30 DIAGNOSIS — Z9049 Acquired absence of other specified parts of digestive tract: Secondary | ICD-10-CM | POA: Insufficient documentation

## 2023-05-30 DIAGNOSIS — R09A2 Foreign body sensation, throat: Secondary | ICD-10-CM

## 2023-05-30 DIAGNOSIS — F17210 Nicotine dependence, cigarettes, uncomplicated: Secondary | ICD-10-CM | POA: Insufficient documentation

## 2023-05-30 DIAGNOSIS — Z7984 Long term (current) use of oral hypoglycemic drugs: Secondary | ICD-10-CM | POA: Insufficient documentation

## 2023-05-30 DIAGNOSIS — G4730 Sleep apnea, unspecified: Secondary | ICD-10-CM | POA: Insufficient documentation

## 2023-05-30 DIAGNOSIS — D126 Benign neoplasm of colon, unspecified: Secondary | ICD-10-CM

## 2023-05-30 DIAGNOSIS — I10 Essential (primary) hypertension: Secondary | ICD-10-CM | POA: Insufficient documentation

## 2023-05-30 DIAGNOSIS — R1310 Dysphagia, unspecified: Secondary | ICD-10-CM | POA: Insufficient documentation

## 2023-05-30 DIAGNOSIS — E119 Type 2 diabetes mellitus without complications: Secondary | ICD-10-CM | POA: Insufficient documentation

## 2023-05-30 DIAGNOSIS — Z1211 Encounter for screening for malignant neoplasm of colon: Secondary | ICD-10-CM | POA: Insufficient documentation

## 2023-05-30 DIAGNOSIS — D122 Benign neoplasm of ascending colon: Secondary | ICD-10-CM | POA: Insufficient documentation

## 2023-05-30 DIAGNOSIS — K219 Gastro-esophageal reflux disease without esophagitis: Secondary | ICD-10-CM | POA: Insufficient documentation

## 2023-05-30 HISTORY — PX: ESOPHAGOGASTRODUODENOSCOPY (EGD) WITH PROPOFOL: SHX5813

## 2023-05-30 HISTORY — PX: COLONOSCOPY WITH PROPOFOL: SHX5780

## 2023-05-30 LAB — GLUCOSE, CAPILLARY: Glucose-Capillary: 110 mg/dL — ABNORMAL HIGH (ref 70–99)

## 2023-05-30 SURGERY — COLONOSCOPY WITH PROPOFOL
Anesthesia: General

## 2023-05-30 MED ORDER — GLYCOPYRROLATE 0.2 MG/ML IJ SOLN
INTRAMUSCULAR | Status: AC
  Filled 2023-05-30: qty 1

## 2023-05-30 MED ORDER — SODIUM CHLORIDE 0.9 % IV SOLN: INTRAVENOUS | Status: DC

## 2023-05-30 MED ORDER — MIDAZOLAM HCL 2 MG/2ML IJ SOLN
INTRAMUSCULAR | Status: DC | PRN
  Administered 2023-05-30: 2 mg via INTRAVENOUS

## 2023-05-30 MED ORDER — MIDAZOLAM HCL 2 MG/2ML IJ SOLN
INTRAMUSCULAR | Status: AC
  Filled 2023-05-30: qty 2

## 2023-05-30 MED ORDER — GLYCOPYRROLATE 0.2 MG/ML IJ SOLN
INTRAMUSCULAR | Status: DC | PRN
  Administered 2023-05-30: .2 mg via INTRAVENOUS

## 2023-05-30 MED ORDER — PROPOFOL 500 MG/50ML IV EMUL
INTRAVENOUS | Status: DC | PRN
  Administered 2023-05-30: 150 ug/kg/min via INTRAVENOUS

## 2023-05-30 MED ORDER — PROPOFOL 10 MG/ML IV BOLUS
INTRAVENOUS | Status: DC | PRN
  Administered 2023-05-30: 50 mg via INTRAVENOUS
  Administered 2023-05-30: 20 mg via INTRAVENOUS

## 2023-05-30 MED ORDER — LIDOCAINE HCL (CARDIAC) PF 100 MG/5ML IV SOSY
PREFILLED_SYRINGE | INTRAVENOUS | Status: DC | PRN
  Administered 2023-05-30: 50 mg via INTRAVENOUS

## 2023-05-30 NOTE — Op Note (Signed)
[facility] Gastroenterology Patient Name: Cameron Watkins Procedure Date: 05/30/2023 8:30 AM MRN: [id number] Account #: [id number] Date of Birth: 1971/06/22 Admit Type: Outpatient Age: 52 Room: [facility] ENDO ROOM 4 Gender: Male Note Status: Finalized Instrument Name: Colonoscope [id number] Procedure:             Colonoscopy Indications:           Screening for colorectal malignant neoplasm Providers:             Wyline Mood MD, MD Referring MD:          Wyline Mood MD, MD (Referring MD), [name], MD                         (Referring MD) Medicines:             Monitored Anesthesia Care Complications:         No immediate complications. Procedure:             Pre-Anesthesia Assessment:                        - Prior to the procedure, a History and Physical was                         performed, and patient medications, allergies and                         sensitivities were reviewed. The patient's tolerance                         of previous anesthesia was reviewed.                        - The risks and benefits of the procedure and the                         sedation options and risks were discussed with the                         patient. All questions were answered and informed                         consent was obtained.                        - ASA Grade Assessment: II - A patient with mild                         systemic disease.                        After obtaining informed consent, the colonoscope was                         passed under direct vision. Throughout the procedure,                         the patient's blood pressure, pulse, and oxygen                         saturations  were monitored continuously. The                         Colonoscope was introduced through the anus and                         advanced to the the cecum, identified by the                         appendiceal orifice. The colonoscopy was performed                          without difficulty. The patient tolerated the                         procedure well. The quality of the bowel preparation                         was adequate. The ileocecal valve, appendiceal                         orifice, and rectum were photographed. Findings:      The perianal and digital rectal examinations were normal.      A 12 mm polyp was found in the distal ascending colon. The polyp was       sessile. The polyp was removed with a cold snare. Resection and       retrieval were complete. To prevent bleeding after the polypectomy, one       hemostatic clip was successfully placed. There was no bleeding during,       or at the end, of the procedure.      Non-bleeding internal hemorrhoids were found during retroflexion. The       hemorrhoids were medium-sized and Grade I (internal hemorrhoids that do       not prolapse).      The exam was otherwise without abnormality on direct and retroflexion       views. Impression:            - One 12 mm polyp in the distal ascending colon,                         removed with a cold snare. Resected and retrieved.                         Clip was placed.                        - Non-bleeding internal hemorrhoids.                        - The examination was otherwise normal on direct and                         retroflexion views. Recommendation:        - Discharge patient to home.                        - Advance diet as tolerated.                        -  Continue present medications.                        - Await pathology results.                        - Repeat colonoscopy in 3 years for surveillance based                         on pathology results. Procedure Code(s):     --- Professional ---                        [phone], Colonoscopy, flexible; with removal of                         tumor(s), polyp(s), or other lesion(s) by snare                         technique Diagnosis Code(s):     --- Professional ---                         Z12.11, Encounter for screening for malignant neoplasm                         of colon                        D12.2, Benign neoplasm of ascending colon                        K64.0, First degree hemorrhoids CPT copyright 2022 American Medical Association. All rights reserved. The codes documented in this report are preliminary and upon coder review may  be revised to meet current compliance requirements. Wyline Mood, MD Wyline Mood MD, MD 05/30/2023 9:04:13 AM This report has been signed electronically. Number of Addenda: 0 Note Initiated On: 05/30/2023 8:30 AM Scope Withdrawal Time: 0 hours 13 minutes 5 seconds  Total Procedure Duration: 0 hours 15 minutes 6 seconds  Estimated Blood Loss:  Estimated blood loss: none.      [facility]

## 2023-05-30 NOTE — H&P (Signed)
Wyline Mood, MD [address], [address], [place], [place], [address] [address], [place], [place], [address] Phone: [phone]  Fax: [phone]  Primary Care Physician:  [name], MD   Pre-Procedure History & Physical: HPI:  Cameron Watkins is a 52 y.o. male is here for an endoscopy and colonoscopy    Past Medical History:  Diagnosis Date   Diabetes mellitus without complication (HCC)    Family history of adverse reaction to anesthesia    "allergic to anesthesia" difficult to wake up   GERD (gastroesophageal reflux disease)    Heart murmur    Hypertension    Leaky heart valve    Polysubstance abuse (HCC)    a.) THC + cocaine   Sleep apnea    Syphilis     Past Surgical History:  Procedure Laterality Date   CHOLECYSTECTOMY     SOFT TISSUE MASS EXCISION  2018   back of head   WISDOM TOOTH EXTRACTION      Prior to Admission medications   Medication Sig Start Date End Date Taking? Authorizing Provider  aspirin EC 81 MG tablet Take 81 mg by mouth daily. Swallow whole.   Yes [provider]  calcium carbonate (TUMS EX) 750 MG chewable tablet Chew 2 tablets by mouth as needed for heartburn.    [provider]  lisinopril-hydrochlorothiazide (ZESTORETIC) 10-12.5 MG tablet Take 1 tablet by mouth daily. 02/01/23   [provider]  metFORMIN (GLUCOPHAGE) 500 MG tablet Take 1 tablet (500 mg total) by mouth 2 (two) times daily with a meal. 12/25/21 05/20/23  Chesley Noon, MD  metoprolol succinate (TOPROL-XL) 50 MG 24 hr tablet Take 50 mg by mouth daily. 02/04/22   [provider]  omeprazole (PRILOSEC) 40 MG capsule Take 1 capsule (40 mg total) by mouth in the morning and at bedtime. 05/20/23   Wyline Mood, MD  ondansetron (ZOFRAN-ODT) 4 MG disintegrating tablet Take 1 tablet (4 mg total) by mouth every 8 (eight) hours as needed for nausea or vomiting. 12/19/22   Irean Hong, MD  oxyCODONE-acetaminophen (PERCOCET/ROXICET) 5-325  MG tablet Take 1 tablet by mouth every 4 (four) hours as needed for severe pain. 12/19/22   Irean Hong, MD    Allergies as of 05/21/2023 - Review Complete 05/20/2023  Allergen Reaction Noted   Green dye Swelling, Rash, and Other (See Comments) 12/25/2021   Bee venom Swelling 08/05/2019   Penicillins Hives 08/05/2019    History reviewed. No pertinent family history.  Social History   Socioeconomic History   Marital status: Married    Spouse name: Jasmine December   Number of children: Not on file   Years of education: Not on file   Highest education level: Not on file  Occupational History   Not on file  Tobacco Use   Smoking status: Every Day    Packs/day: .5    Types: Cigarettes   Smokeless tobacco: Never  Vaping Use   Vaping Use: Never used  Substance and Sexual Activity   Alcohol use: Not Currently    Alcohol/week: 4.0 standard drinks of alcohol    Types: 4 Shots of liquor per week   Drug use: Not Currently    Types: Marijuana    Comment: occasional   Sexual activity: Yes    Partners: Female    Birth control/protection: Condom  Other Topics Concern   Not on file  Social History Narrative   Not on file   Social Determinants  of Health   Financial Resource Strain: Not on file  Food Insecurity: Not on file  Transportation Needs: Not on file  Physical Activity: Not on file  Stress: Not on file  Social Connections: Not on file  Intimate Partner Violence: Not on file    Review of Systems: See HPI, otherwise negative ROS  Physical Exam: BP (!) 144/90   Pulse 72   Temp (!) 97 F (36.1 C) (Temporal)   Wt 130.2 kg   BMI 35.87 kg/m  General:   Alert,  pleasant and cooperative in NAD Head:  Normocephalic and atraumatic. Neck:  Supple; no masses or thyromegaly. Lungs:  Clear throughout to auscultation, normal respiratory effort.    Heart:  +S1, +S2, Regular rate and rhythm, No edema. Abdomen:  Soft, nontender and nondistended. Normal bowel sounds, without guarding,  and without rebound.   Neurologic:  Alert and  oriented x4;  grossly normal neurologically.  Impression/Plan: Cameron Watkins is here for an endoscopy and colonoscopy  to be performed for  evaluation of dysphagia and colon cancer screening     Risks, benefits, limitations, and alternatives regarding endoscopy have been reviewed with the patient.  Questions have been answered.  All parties agreeable.   Wyline Mood, MD  05/30/2023, 8:34 AM

## 2023-05-30 NOTE — Op Note (Signed)
[facility] Gastroenterology Patient Name: Cameron Watkins Procedure Date: 05/30/2023 8:31 AM MRN: [id number] Account #: [id number] Date of Birth: 1971-02-26 Admit Type: Outpatient Age: 52 Room: [facility] ENDO ROOM 4 Gender: Male Note Status: Finalized Instrument Name: Upper Endoscope [id number] Procedure:             Upper GI endoscopy Indications:           Dysphagia Providers:             Wyline Mood MD, MD Referring MD:          Wyline Mood MD, MD (Referring MD), [name], MD                         (Referring MD) Medicines:             Monitored Anesthesia Care Complications:         No immediate complications. Procedure:             Pre-Anesthesia Assessment:                        - Prior to the procedure, a History and Physical was                         performed, and patient medications, allergies and                         sensitivities were reviewed. The patient's tolerance                         of previous anesthesia was reviewed.                        - The risks and benefits of the procedure and the                         sedation options and risks were discussed with the                         patient. All questions were answered and informed                         consent was obtained.                        - ASA Grade Assessment: II - A patient with mild                         systemic disease.                        After obtaining informed consent, the endoscope was                         passed under direct vision. Throughout the procedure,                         the patient's blood pressure, pulse, and oxygen                         saturations were  monitored continuously. The Endoscope                         was introduced through the mouth, and advanced to the                         third part of duodenum. The upper GI endoscopy was                         accomplished with ease. The patient tolerated the                          procedure well. Findings:      The examined duodenum was normal.      The stomach was normal.      The cardia and gastric fundus were normal on retroflexion.      The examined esophagus was normal. Biopsies were taken with a cold       forceps for histology.      Bilious fluid was found on the greater curvature of the stomach. Impression:            - Normal examined duodenum.                        - Normal stomach.                        - Normal esophagus. Biopsied.                        - Bilious gastric fluid. Recommendation:        - Await pathology results.                        - Perform a colonoscopy today. Procedure Code(s):     --- Professional ---                        [phone], Esophagogastroduodenoscopy, flexible,                         transoral; with biopsy, single or multiple Diagnosis Code(s):     --- Professional ---                        R13.10, Dysphagia, unspecified CPT copyright 2022 American Medical Association. All rights reserved. The codes documented in this report are preliminary and upon coder review may  be revised to meet current compliance requirements. Wyline Mood, MD Wyline Mood MD, MD 05/30/2023 8:46:05 AM This report has been signed electronically. Number of Addenda: 0 Note Initiated On: 05/30/2023 8:31 AM Estimated Blood Loss:  Estimated blood loss: none.      [facility]

## 2023-05-30 NOTE — Anesthesia Preprocedure Evaluation (Signed)
Anesthesia Evaluation  Patient identified by MRN, date of birth, ID band Patient awake    Reviewed: Allergy & Precautions, NPO status , Patient's Chart, lab work & pertinent test results  Airway Mallampati: III  TM Distance: >3 FB Neck ROM: full    Dental  (+) Chipped, Dental Advidsory Given   Pulmonary sleep apnea and Continuous Positive Airway Pressure Ventilation , Current Smoker   Pulmonary exam normal        Cardiovascular hypertension, (-) angina negative cardio ROS Normal cardiovascular exam     Neuro/Psych negative neurological ROS  negative psych ROS   GI/Hepatic Neg liver ROS,GERD  Medicated,,  Endo/Other  negative endocrine ROSdiabetes    Renal/GU negative Renal ROS  negative genitourinary   Musculoskeletal   Abdominal   Peds  Hematology negative hematology ROS (+)   Anesthesia Other Findings Past Medical History: No date: Diabetes mellitus without complication (HCC) No date: Family history of adverse reaction to anesthesia     Comment:  "allergic to anesthesia" difficult to wake up No date: GERD (gastroesophageal reflux disease) No date: Heart murmur No date: Hypertension No date: Leaky heart valve No date: Polysubstance abuse (HCC)     Comment:  a.) THC + cocaine No date: Sleep apnea No date: Syphilis  Past Surgical History: No date: CHOLECYSTECTOMY 2018: SOFT TISSUE MASS EXCISION     Comment:  back of head No date: WISDOM TOOTH EXTRACTION  BMI    Body Mass Index: 35.87 kg/m      Reproductive/Obstetrics negative OB ROS                             Anesthesia Physical Anesthesia Plan  ASA: 3  Anesthesia Plan: General   Post-op Pain Management: Minimal or no pain anticipated   Induction: Intravenous  PONV Risk Score and Plan: 3 and Propofol infusion, TIVA and Ondansetron  Airway Management Planned: Nasal Cannula  Additional Equipment: None  Intra-op  Plan:   Post-operative Plan:   Informed Consent: I have reviewed the patients History and Physical, chart, labs and discussed the procedure including the risks, benefits and alternatives for the proposed anesthesia with the patient or authorized representative who has indicated his/her understanding and acceptance.     Dental advisory given  Plan Discussed with: CRNA and Surgeon  Anesthesia Plan Comments: (Discussed risks of anesthesia with patient, including possibility of difficulty with spontaneous ventilation under anesthesia necessitating airway intervention, PONV, and rare risks such as cardiac or respiratory or neurological events, and allergic reactions. Discussed the role of CRNA in patient's perioperative care. Patient understands.)        Anesthesia Quick Evaluation

## 2023-05-30 NOTE — Anesthesia Postprocedure Evaluation (Signed)
Anesthesia Post Note  Patient: Film/video editor  Procedure(s) Performed: COLONOSCOPY WITH PROPOFOL ESOPHAGOGASTRODUODENOSCOPY (EGD) WITH PROPOFOL  Patient location during evaluation: Endoscopy Anesthesia Type: General Level of consciousness: awake and alert Pain management: pain level controlled Vital Signs Assessment: post-procedure vital signs reviewed and stable Respiratory status: spontaneous breathing, nonlabored ventilation, respiratory function stable and patient connected to nasal cannula oxygen Cardiovascular status: blood pressure returned to baseline and stable Postop Assessment: no apparent nausea or vomiting Anesthetic complications: no  No notable events documented.   Last Vitals:  Vitals:   05/30/23 0936 05/30/23 0946  BP: 138/86 136/87  Pulse: 77 73  Resp: (!) 23 15  Temp:    SpO2: 99% 100%    Last Pain:  Vitals:   05/30/23 0946  TempSrc:   PainSc: 0-No pain                 Stephanie Coup

## 2023-05-30 NOTE — Anesthesia Procedure Notes (Signed)
Date/Time: 05/30/2023 8:38 AM  Performed by: Elmarie Mainland, CRNAPre-anesthesia Checklist: Patient identified, Emergency Drugs available, Suction available and Patient being monitored Patient Re-evaluated:Patient Re-evaluated prior to induction Oxygen Delivery Method: Supernova nasal CPAP

## 2023-05-30 NOTE — Transfer of Care (Signed)
Immediate Anesthesia Transfer of Care Note  Patient: Cameron Watkins  Procedure(s) Performed: COLONOSCOPY WITH PROPOFOL ESOPHAGOGASTRODUODENOSCOPY (EGD) WITH PROPOFOL  Patient Location: PACU and Endoscopy Unit  Anesthesia Type:General  Level of Consciousness: drowsy and patient cooperative  Airway & Oxygen Therapy: Patient Spontanous Breathing and Patient connected to face mask oxygen  Post-op Assessment: Report given to RN and Post -op Vital signs reviewed and stable  Post vital signs: Reviewed and stable  Last Vitals:  Vitals Value Taken Time  BP 130/80 05/30/23 0907  Temp    Pulse 84 05/30/23 0907  Resp 28 05/30/23 0907  SpO2 98 % 05/30/23 0907  Vitals shown include unvalidated device data.  Last Pain:  Vitals:   05/30/23 0807  TempSrc: Temporal  PainSc: 0-No pain         Complications: No notable events documented.

## 2023-06-02 ENCOUNTER — Encounter: Payer: Self-pay | Admitting: Gastroenterology

## 2023-06-04 ENCOUNTER — Telehealth: Payer: Self-pay

## 2023-06-04 NOTE — Telephone Encounter (Signed)
Per pt still haven't gotten approval on medicaid as of 06-04-23.Will call pt back to later this week to check to see if he has gotten it.

## 2023-07-23 ENCOUNTER — Other Ambulatory Visit: Payer: Self-pay

## 2023-07-25 ENCOUNTER — Ambulatory Visit: Payer: Self-pay | Admitting: Physician Assistant

## 2023-07-25 NOTE — Progress Notes (Deleted)
Celso Amy, PA-C [address]  [address]  [place], [place] [address]  Main: [phone]  Fax: [phone]   Primary Care Physician: [name], MD  Primary Gastroenterologist:  Celso Amy, PA-C / Dr. Wyline Mood    CC: 2 month F/U GERD  HPI: Cameron Watkins is a 52 y.o. male returns for 62-month follow-up of GERD.  Was started on Prilosec 40 mg twice daily by Dr. Tobi Bastos 2 months ago.  Was also advised to avoid marijuana and Cocaine.  Avoid GERD trigger foods.  EGD 05/30/2023 by Dr. Tobi Bastos showed bilious gastric fluid in the stomach, otherwise normal.  Biopsies showed reflux esophagitis with no intestinal metaplasia.  Colonoscopy 05/30/2023 showed 12 mm sessile hyperplastic polyp removed from the ascending colon.  Clips placed.  Nonbleeding medium internal hemorrhoids.  Adequate prep.  3-year repeat.  01/31/2023 CT scan of the abdomen and pelvis with contrast showed no acute findings postcholecystectomy.   Current Outpatient Medications  Medication Sig Dispense Refill   aspirin EC 81 MG tablet Take 81 mg by mouth daily. Swallow whole.     calcium carbonate (TUMS EX) 750 MG chewable tablet Chew 2 tablets by mouth as needed for heartburn.     lisinopril-hydrochlorothiazide (ZESTORETIC) 10-12.5 MG tablet Take 1 tablet by mouth daily.     metFORMIN (GLUCOPHAGE) 500 MG tablet Take 1 tablet (500 mg total) by mouth 2 (two) times daily with a meal. 60 tablet 1   metoprolol succinate (TOPROL-XL) 50 MG 24 hr tablet Take 50 mg by mouth daily.     omeprazole (PRILOSEC) 40 MG capsule Take 1 capsule (40 mg total) by mouth in the morning and at bedtime. 180 capsule 0   ondansetron (ZOFRAN-ODT) 4 MG disintegrating tablet Take 1 tablet (4 mg total) by mouth every 8 (eight) hours as needed for nausea or vomiting. 30 tablet 0   oxyCODONE-acetaminophen (PERCOCET/ROXICET) 5-325 MG tablet Take 1 tablet by mouth every 4 (four) hours as needed for severe pain. 30 tablet 0   No current  facility-administered medications for this visit.    Allergies as of 07/25/2023 - Review Complete 05/30/2023  Allergen Reaction Noted   Green dye Swelling, Rash, and Other (See Comments) 12/25/2021   Bee venom Swelling 08/05/2019   Penicillins Hives 08/05/2019    Past Medical History:  Diagnosis Date   Diabetes mellitus without complication (HCC)    Family history of adverse reaction to anesthesia    "allergic to anesthesia" difficult to wake up   GERD (gastroesophageal reflux disease)    Heart murmur    Hypertension    Leaky heart valve    Polysubstance abuse (HCC)    a.) THC + cocaine   Sleep apnea    Syphilis     Past Surgical History:  Procedure Laterality Date   CHOLECYSTECTOMY     COLONOSCOPY WITH PROPOFOL N/A 05/30/2023   Procedure: COLONOSCOPY WITH PROPOFOL;  Surgeon: Wyline Mood, MD;  Location: [facility] ENDOSCOPY;  Service: Gastroenterology;  Laterality: N/A;   ESOPHAGOGASTRODUODENOSCOPY (EGD) WITH PROPOFOL N/A 05/30/2023   Procedure: ESOPHAGOGASTRODUODENOSCOPY (EGD) WITH PROPOFOL;  Surgeon: Wyline Mood, MD;  Location: [facility] ENDOSCOPY;  Service: Gastroenterology;  Laterality: N/A;   SOFT TISSUE MASS EXCISION  2018   back of head   WISDOM TOOTH EXTRACTION      Review of Systems:    All systems reviewed and negative except where noted in HPI.   Physical Examination:   There were no vitals taken for this visit.  General: Well-nourished,  well-developed in no acute distress.  Eyes: No icterus. Conjunctivae pink. Mouth: Oropharyngeal mucosa moist and pink , no lesions erythema or exudate. Lungs: Clear to auscultation bilaterally. Non-labored. Heart: Regular rate and rhythm, no murmurs rubs or gallops.  Abdomen: Bowel sounds are normal; Abdomen is Soft; No hepatosplenomegaly, masses or hernias;  No Abdominal Tenderness; No guarding or rebound tenderness. Extremities: No lower extremity edema. No clubbing or deformities. Neuro: Alert and oriented x 3.  Grossly  intact. Skin: Warm and dry, no jaundice.   Psych: Alert and cooperative, normal mood and affect.   Imaging Studies: No results found.  Assessment and Plan:   Bonifacio Wolner is a 52 y.o. y/o male returns for follow-up of:  1.  GERD with esophagitis  Decrease omeprazole to 40 mg once daily long-term.  Recommend Lifestyle Modifications to prevent Acid Reflux.  Rec. Avoid coffee, sodas, peppermint, citrus fruits, and spicey foods.  Avoid eating 2-3 hours before bedtime.   2.  Bile reflux postcholecystectomy  Add Carafate if epigastric pain.  3.  History of colon polyps  3-year repeat colonoscopy    Celso Amy, PA-C  Follow up ***  BP check ***

## 2023-08-16 ENCOUNTER — Other Ambulatory Visit: Payer: Self-pay | Admitting: Gastroenterology
